# Patient Record
Sex: Male | Born: 1995 | Race: Black or African American | Hispanic: No | Marital: Single | Smoking: Current every day smoker
Health system: Southern US, Community
[De-identification: ages and names within clinical notes are randomized; demographics above are authoritative.]

## PROBLEM LIST (undated history)

## (undated) DIAGNOSIS — F909 Attention-deficit hyperactivity disorder, unspecified type: Secondary | ICD-10-CM

---
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
## 2016-03-14 ENCOUNTER — Encounter: Payer: 59 | Admitting: Licensed Clinical Social Worker

## 2016-03-14 ENCOUNTER — Encounter: Payer: Self-pay | Admitting: Family

## 2016-03-14 ENCOUNTER — Ambulatory Visit (INDEPENDENT_AMBULATORY_CARE_PROVIDER_SITE_OTHER): Payer: 59 | Admitting: Family

## 2016-03-14 VITALS — BP 130/83 | HR 57 | Ht 71.65 in | Wt 204.2 lb

## 2016-03-14 DIAGNOSIS — Z202 Contact with and (suspected) exposure to infections with a predominantly sexual mode of transmission: Secondary | ICD-10-CM | POA: Diagnosis not present

## 2016-03-14 DIAGNOSIS — Z113 Encounter for screening for infections with a predominantly sexual mode of transmission: Secondary | ICD-10-CM | POA: Diagnosis not present

## 2016-03-14 MED ORDER — AZITHROMYCIN 500 MG PO TABS
1000.0000 mg | ORAL_TABLET | Freq: Once | ORAL | Status: AC
  Administered 2016-03-14: 1000 mg via ORAL

## 2016-03-14 MED ORDER — CEFTRIAXONE SODIUM 250 MG IJ SOLR
250.0000 mg | Freq: Once | INTRAMUSCULAR | Status: AC
  Administered 2016-03-14: 250 mg via INTRAMUSCULAR

## 2016-03-14 NOTE — Progress Notes (Signed)
History was provided by the patient.  Daniel [name] is a 20 y.o. male who is here for gonorrhea exposure.   PCP confirmed? No   HPI:   Noticed penile discharge for about a week; discharge has stopped about a week now.  No burning with urination.  Male partners only; 3-4 male partners; 2 only now. One with condom, one without.  Both partners went to dr. Was told she had gonorrhea. Vaginal intercourse only.   Donates plasma - told he was fine. Tried to donate   Review of Systems  Constitutional: Negative for malaise/fatigue.  Eyes: Negative for double vision.  Respiratory: Negative for shortness of breath.   Cardiovascular: Negative for chest pain and palpitations.  Gastrointestinal: Negative for abdominal pain, constipation, diarrhea, nausea and vomiting.  Genitourinary: Negative for dysuria.  Musculoskeletal: Negative for joint pain and myalgias.  Skin: Negative for rash.  Neurological: Negative for dizziness and headaches.  Endo/Heme/Allergies: Does not bruise/bleed easily.    Patient Active Problem List   Diagnosis Date Noted  . ADHD (attention deficit hyperactivity disorder) 08/31/2014  . Gonorrhea 04/14/2014  . Chlamydia 04/14/2014    Current Outpatient Prescriptions on File Prior to Visit  Medication Sig Dispense Refill  . guanFACINE (INTUNIV) 1 MG TB24 Take 1 mg by mouth daily.     No current facility-administered medications on file prior to visit.     No Known Allergies  Physical Exam:    Vitals:   03/14/16 0938  Weight: 204 lb 3.2 oz (92.6 kg)  Height: 5' 11.65" (1.82 m)    No blood pressure reading on file for this encounter. No LMP for male patient.  Physical Exam  Constitutional: He appears well-developed. No distress.  HENT:  Mouth/Throat: Oropharynx is clear and moist.  Cardiovascular: Normal rate and regular rhythm.   No murmur heard. Pulmonary/Chest: Breath sounds normal.  Abdominal: Soft. He exhibits no mass. There is no  tenderness. There is no guarding.  Genitourinary: Penis normal. No penile tenderness.  Genitourinary Comments: No discharge   Musculoskeletal: He exhibits no edema.  Lymphadenopathy:    He has no cervical adenopathy.  Neurological: He is alert.  Skin: Skin is warm. No rash noted.  Psychiatric: He has a normal mood and affect.    Assessment/Plan: 1. Exposure to gonorrhea -will treat due to known exposure.  -advised to contact all partners -abstain x 7 days -condoms given; safe sex practices reviewed.  - cefTRIAXone (ROCEPHIN) injection 250 mg; Inject 250 mg into the muscle once. - azithromycin (ZITHROMAX) tablet 1,000 mg; Take 2 tablets (1,000 mg total) by mouth once.  2. Routine screening for STI (sexually transmitted infection) -per protocol  - GC/Chlamydia Probe Amp - RPR - HIV antibody - GC/CT Probe, Amp (Throat)

## 2016-03-14 NOTE — Patient Instructions (Signed)
We will call you with test results.  If there are other partners you need to notify, you can use  [email or web address] website to let them know.  You put their phone number in the website and they are notified. It is anonymous and not tracked.   Thank you for coming in today! Wear condoms to reduce the risks of future infections!

## 2016-03-15 LAB — GC/CHLAMYDIA PROBE AMP
CT Probe RNA: NOT DETECTED
GC PROBE AMP APTIMA: DETECTED — AB

## 2016-03-15 LAB — HIV ANTIBODY (ROUTINE TESTING W REFLEX): HIV 1&2 Ab, 4th Generation: NONREACTIVE

## 2016-03-15 LAB — RPR

## 2016-03-17 LAB — GC/CHLAMYDIA PROBE, AMP (THROAT)
CHLAMYDIA TRACHOMATIS RNA (THROAT) APTIMA: NOT DETECTED
NEISSERIA GONORRHOEAE RNA (THROAT): NOT DETECTED

## 2017-06-16 ENCOUNTER — Emergency Department (HOSPITAL_COMMUNITY)
Admission: EM | Admit: 2017-06-16 | Discharge: 2017-06-17 | Disposition: A | Discharge status: Home / Self Care | Payer: 59 | Attending: Emergency Medicine | Admitting: Emergency Medicine

## 2017-06-16 ENCOUNTER — Encounter (HOSPITAL_COMMUNITY): Payer: Self-pay

## 2017-06-16 ENCOUNTER — Other Ambulatory Visit: Payer: Self-pay

## 2017-06-16 ENCOUNTER — Emergency Department (HOSPITAL_COMMUNITY): Payer: 59

## 2017-06-16 DIAGNOSIS — J189 Pneumonia, unspecified organism: Secondary | ICD-10-CM | POA: Insufficient documentation

## 2017-06-16 DIAGNOSIS — R05 Cough: Secondary | ICD-10-CM | POA: Diagnosis not present

## 2017-06-16 DIAGNOSIS — J029 Acute pharyngitis, unspecified: Secondary | ICD-10-CM | POA: Diagnosis present

## 2017-06-16 DIAGNOSIS — Z79899 Other long term (current) drug therapy: Secondary | ICD-10-CM | POA: Diagnosis not present

## 2017-06-16 DIAGNOSIS — F909 Attention-deficit hyperactivity disorder, unspecified type: Secondary | ICD-10-CM | POA: Insufficient documentation

## 2017-06-16 DIAGNOSIS — F17200 Nicotine dependence, unspecified, uncomplicated: Secondary | ICD-10-CM | POA: Insufficient documentation

## 2017-06-16 LAB — RAPID STREP SCREEN (MED CTR MEBANE ONLY): Streptococcus, Group A Screen (Direct): NEGATIVE

## 2017-06-16 MED ORDER — ACETAMINOPHEN 325 MG PO TABS
650.0000 mg | ORAL_TABLET | Freq: Once | ORAL | Status: AC | PRN
  Administered 2017-06-16: 650 mg via ORAL
  Filled 2017-06-16: qty 2

## 2017-06-16 MED ORDER — AZITHROMYCIN 250 MG PO TABS: 250.0000 mg | ORAL_TABLET | Freq: Every day | ORAL | 0 refills | Status: AC

## 2017-06-16 MED ORDER — IBUPROFEN 400 MG PO TABS
600.0000 mg | ORAL_TABLET | Freq: Once | ORAL | Status: AC
  Administered 2017-06-16: 23:00:00 600 mg via ORAL
  Filled 2017-06-16: qty 1

## 2017-06-16 MED ORDER — AZITHROMYCIN 250 MG PO TABS
500.0000 mg | ORAL_TABLET | Freq: Once | ORAL | Status: AC
  Administered 2017-06-17: 500 mg via ORAL
  Filled 2017-06-16: qty 2

## 2017-06-16 MED ORDER — ACETAMINOPHEN 325 MG PO TABS: 650.0000 mg | ORAL_TABLET | Freq: Once | ORAL | Status: DC

## 2017-06-16 NOTE — ED Provider Notes (Signed)
[name]   CSN: [id number] Arrival date & time: 06/16/17  2037     History   Chief Complaint Chief Complaint  Patient presents with  . Influenza    HPI Daniel KoyanagiJeremy B [name] is a 22 y.o. male presenting to the ED for acute onset of sore throat, cough, myalgias, fever and headache that began on Sunday.  Patient states his cough was intermittently productive.  Reports associated dysphagia, however no difficulty breathing or swallowing.  Has been treating his symptoms with Tylenol with some relief.  Denies chest pain, shortness of breath, abdominal pain, neck stiffness or pain, rash.  Denies recent illness or antibiotics.  No sick contacts.  Has not had a flu shot this year.  The history is provided by the patient.    Past Medical History:  Diagnosis Date  . ADHD (attention deficit hyperactivity disorder)     Patient Active Problem List   Diagnosis Date Noted  . ADHD (attention deficit hyperactivity disorder) 08/31/2014  . Gonorrhea 04/14/2014  . Chlamydia 04/14/2014    History reviewed. No pertinent surgical history.     Home Medications    Prior to Admission medications   Medication Sig Start Date End Date Taking? Authorizing Provider  azithromycin (ZITHROMAX) 250 MG tablet Take 1 tablet (250 mg total) by mouth daily for 4 days. Starting tomorrow, take 1 tablet every day until finished. 06/16/17 06/20/17  Mohmed Farver, [place] N, PA-C  guanFACINE (INTUNIV) 1 MG TB24 Take 1 mg by mouth daily.    [provider]    Family History No family history on file.  Social History Social History   Tobacco Use  . Smoking status: Current Every Day Smoker  . Smokeless tobacco: Never Used  Substance Use Topics  . Alcohol use: No    Alcohol/week: 0.0 oz  . Drug use: No     Allergies   Patient has no known allergies.   Review of Systems Review of Systems  Constitutional: Positive for fever.  HENT:  Positive for congestion and sore throat. Negative for ear pain, trouble swallowing and voice change.   Eyes: Negative for photophobia and visual disturbance.  Respiratory: Positive for cough. Negative for shortness of breath.   Cardiovascular: Negative for chest pain.  Gastrointestinal: Positive for nausea. Negative for abdominal pain.  Musculoskeletal: Positive for myalgias (generalized).  Neurological: Positive for headaches.  All other systems reviewed and are negative.    Physical Exam Updated Vital Signs BP 129/81 (BP Location: Left Arm)   Pulse 99   Temp (!) 101.3 F (38.5 C) (Oral)   Resp 17   Ht 6\' 2"  (1.88 m)   Wt 109.8 kg (242 lb)   SpO2 99%   BMI 31.07 kg/m   Physical Exam  Constitutional: He appears well-developed and well-nourished. No distress.  Tolerating secretions.  HENT:  Head: Normocephalic and atraumatic.  Right Ear: Tympanic membrane, external ear and ear canal normal.  Left Ear: Tympanic membrane, external ear and ear canal normal.  Nose: Nose normal.  Mouth/Throat: Uvula is midline. No trismus in the jaw. No uvula swelling. No tonsillar exudate.  Pharynx with erythema, no edema or exudates. Tolerating secretions.  Eyes: Conjunctivae are normal.  Neck: Normal range of motion. Neck supple.  Cardiovascular: Normal rate, regular rhythm, normal heart sounds and intact distal pulses. Exam reveals no gallop and no friction rub.  No murmur heard. Pulmonary/Chest: Effort normal. No stridor. No respiratory distress. He has no wheezes. He has no  rales.  Mildly decreased breath sounds left lung.  Abdominal: Soft. Bowel sounds are normal. He exhibits no distension. There is no tenderness.  Lymphadenopathy:    He has no cervical adenopathy.  Neurological: He is alert.  Skin: Skin is warm.  Psychiatric: He has a normal mood and affect. His behavior is normal.  Nursing note and vitals reviewed.    ED Treatments / Results  Labs (all labs ordered are listed,  but only abnormal results are displayed) Labs Reviewed  RAPID STREP SCREEN (NOT AT [name])  CULTURE, GROUP A STREP [facility])    EKG  EKG Interpretation None       Radiology Dg Chest 2 View  Result Date: 06/16/2017 CLINICAL DATA:  22 y/o  M; cough and fever. EXAM: CHEST  2 VIEW COMPARISON:  None. FINDINGS: Normal cardiac silhouette. Patchy left lower lobe and left upper lobe lingula pneumonia. No pleural effusion or pneumothorax. Bones are unremarkable. IMPRESSION: Patchy left lower lobe and left upper lobe lingula pneumonia. Electronically Signed   By: Mitzi Hansen M.D.   On: 06/16/2017 23:43    Procedures Procedures (including critical care time)  Medications Ordered in ED Medications  acetaminophen (TYLENOL) tablet 650 mg (650 mg Oral Given 06/16/17 2050)  ibuprofen (ADVIL,MOTRIN) tablet 600 mg (600 mg Oral Given 06/16/17 2326)  azithromycin (ZITHROMAX) tablet 500 mg (500 mg Oral Given 06/17/17 0003)     Initial Impression / Assessment and Plan / ED Course  I have reviewed the triage vital signs and the nursing notes.  Pertinent labs & imaging results that were available during my care of the patient were reviewed by me and considered in my medical decision making (see chart for details).     Patient has been diagnosed with CAP via chest xray. Pt initially febrile and slightly tachycardic, however vital signs normalized after PO tylenol. Pt is not ill appearing, immunocompromised, and does not have multiple co-morbidities, therefore I feel like the they can be treated as an OP with abx therapy. Tolerating secretions, no inc work of breathing, O2 sat 99% on RA. First dose of Azithromycin given in ED. Will send with Rx for remaining doses. PCP follow up recommended and discussed. Pt has been advised to return to the ED if symptoms worsen or they do not improve. Pt verbalizes understanding and is agreeable with plan.   Discussed results, findings, treatment and follow up.  Patient advised of return precautions. Patient verbalized understanding and agreed with plan.  Final Clinical Impressions(s) / ED Diagnoses   Final diagnoses:  Community acquired pneumonia of left lung, unspecified part of lung    ED Discharge Orders        Ordered    azithromycin (ZITHROMAX) 250 MG tablet  Daily     06/16/17 2359       Demareon Coldwell, [place] N, PA-C 06/17/17 0010    Nira Conn, MD 06/17/17 0022

## 2017-06-16 NOTE — ED Triage Notes (Signed)
Pt states for the past two days, has had headache, nausea, sore throat, cough, malaise, and fevers

## 2017-06-16 NOTE — Discharge Instructions (Signed)
Please read instructions below.  You can take tylenol as needed for sore throat or body aches. Alternate this with advil/ibuprofen.  Drink plenty of water.  Tomorrow, begin taking the antibiotic daily until gone.  Follow up with your primary care provider.  Return to the ER for difficulty swallowing liquids, difficulty breathing, or new or worsening symptoms.

## 2017-06-16 NOTE — ED Notes (Signed)
[facility] PA request a reassessment of vitals before accepting pt on [facility] side.

## 2017-06-16 NOTE — ED Notes (Signed)
Pt to xray

## 2017-06-19 LAB — CULTURE, GROUP A STREP (THRC)

## 2018-08-20 ENCOUNTER — Other Ambulatory Visit: Payer: Self-pay

## 2018-08-20 ENCOUNTER — Encounter (HOSPITAL_COMMUNITY): Payer: Self-pay

## 2018-08-20 ENCOUNTER — Emergency Department (HOSPITAL_COMMUNITY)
Admission: EM | Admit: 2018-08-20 | Discharge: 2018-08-20 | Disposition: A | Discharge status: Home / Self Care | Payer: 59 | Attending: Emergency Medicine | Admitting: Emergency Medicine

## 2018-08-20 DIAGNOSIS — H10213 Acute toxic conjunctivitis, bilateral: Secondary | ICD-10-CM | POA: Diagnosis not present

## 2018-08-20 DIAGNOSIS — F909 Attention-deficit hyperactivity disorder, unspecified type: Secondary | ICD-10-CM | POA: Diagnosis not present

## 2018-08-20 DIAGNOSIS — F17210 Nicotine dependence, cigarettes, uncomplicated: Secondary | ICD-10-CM | POA: Insufficient documentation

## 2018-08-20 DIAGNOSIS — T65893A Toxic effect of other specified substances, assault, initial encounter: Secondary | ICD-10-CM | POA: Diagnosis not present

## 2018-08-20 DIAGNOSIS — H5789 Other specified disorders of eye and adnexa: Secondary | ICD-10-CM | POA: Diagnosis not present

## 2018-08-20 DIAGNOSIS — Z79899 Other long term (current) drug therapy: Secondary | ICD-10-CM | POA: Insufficient documentation

## 2018-08-20 DIAGNOSIS — L253 Unspecified contact dermatitis due to other chemical products: Secondary | ICD-10-CM
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
## 2019-05-12 ENCOUNTER — Other Ambulatory Visit: Payer: Self-pay

## 2019-05-12 ENCOUNTER — Emergency Department (HOSPITAL_COMMUNITY)
Admission: EM | Admit: 2019-05-12 | Discharge: 2019-05-12 | Disposition: A | Discharge status: Home / Self Care | Payer: 59 | Attending: Emergency Medicine | Admitting: Emergency Medicine

## 2019-05-12 ENCOUNTER — Emergency Department (HOSPITAL_COMMUNITY): Payer: 59

## 2019-05-12 DIAGNOSIS — R079 Chest pain, unspecified: Secondary | ICD-10-CM

## 2019-05-12 DIAGNOSIS — R0789 Other chest pain: Secondary | ICD-10-CM | POA: Diagnosis not present

## 2019-05-12 DIAGNOSIS — F17200 Nicotine dependence, unspecified, uncomplicated: Secondary | ICD-10-CM | POA: Diagnosis not present

## 2019-05-12 LAB — BASIC METABOLIC PANEL
Anion gap: 10 (ref 5–15)
BUN: 9 mg/dL (ref 6–20)
CO2: 24 mmol/L (ref 22–32)
Calcium: 9 mg/dL (ref 8.9–10.3)
Chloride: 107 mmol/L (ref 98–111)
Creatinine, Ser: 0.86 mg/dL (ref 0.61–1.24)
GFR calc Af Amer: >60 mL/min (ref >60)
GFR calc non Af Amer: >60 mL/min (ref >60)
Glucose, Bld: 121 mg/dL — ABNORMAL HIGH (ref 70–99)
Potassium: 3.7 mmol/L (ref 3.5–5.1)
Sodium: 141 mmol/L (ref 135–145)

## 2019-05-12 LAB — CBC
HCT: 45.7 % (ref 39.0–52.0)
Hemoglobin: 15.1 g/dL (ref 13.0–17.0)
MCH: 30.8 pg (ref 26.0–34.0)
MCHC: 33 g/dL (ref 30.0–36.0)
MCV: 93.3 fL (ref 80.0–100.0)
Platelets: 274 10*3/uL (ref 150–400)
RBC: 4.9 MIL/uL (ref 4.22–5.81)
RDW: 12.3 % (ref 11.5–15.5)
WBC: 6.7 10*3/uL (ref 4.0–10.5)
nRBC: 0 % (ref 0.0–0.2)

## 2019-05-12 LAB — TROPONIN I (HIGH SENSITIVITY): Troponin I (High Sensitivity): 3 ng/L (ref <18)

## 2019-05-12 MED ORDER — ACETAMINOPHEN 500 MG PO TABS
1000.0000 mg | ORAL_TABLET | Freq: Once | ORAL | Status: AC
  Administered 2019-05-12: 1000 mg via ORAL
  Filled 2019-05-12: qty 2

## 2019-05-12 NOTE — ED Triage Notes (Signed)
Pt endorses mid chest pain and "being hot and cold" x 2 days, worse today. Denies shob, n/v. VSS

## 2019-05-12 NOTE — ED Provider Notes (Signed)
[name] EMERGENCY DEPARTMENT Provider Note   CSN: [id number] Arrival date & time: 05/12/19  1548     History Chief Complaint  Patient presents with  . Chest Pain    Daniel [name] is a 23 y.o. male.  The history is provided by the patient.  Chest Pain Pain location:  Substernal area Pain quality: aching   Pain radiates to:  Does not radiate Pain severity:  Mild Onset quality:  Gradual Duration:  2 days Timing:  Intermittent Progression:  Waxing and waning Chronicity:  New Context: at rest   Relieved by:  Nothing Worsened by:  Nothing Associated symptoms: no abdominal pain, no back pain, no cough, no fever, no palpitations, no shortness of breath and no vomiting   Risk factors: no coronary artery disease, no high cholesterol, no hypertension, no prior DVT/PE and no smoking        Past Medical History:  Diagnosis Date  . ADHD (attention deficit hyperactivity disorder)     Patient Active Problem List   Diagnosis Date Noted  . ADHD (attention deficit hyperactivity disorder) 08/31/2014  . Gonorrhea 04/14/2014  . Chlamydia 04/14/2014    No past surgical history on file.     No family history on file.  Social History   Tobacco Use  . Smoking status: Current Every Day Smoker  . Smokeless tobacco: Never Used  Substance Use Topics  . Alcohol use: No    Alcohol/week: 0.0 standard drinks  . Drug use: No    Home Medications Prior to Admission medications   Medication Sig Start Date End Date Taking? Authorizing Provider  ibuprofen (ADVIL) 200 MG tablet Take 400 mg by mouth every 6 (six) hours as needed for mild pain.   Yes [provider]    Allergies    Patient has no known allergies.  Review of Systems   Review of Systems  Constitutional: Negative for chills and fever.  HENT: Positive for congestion, sinus pressure and sinus pain. Negative for ear pain and sore throat.   Eyes: Negative for pain and visual  disturbance.  Respiratory: Negative for cough and shortness of breath.   Cardiovascular: Positive for chest pain. Negative for palpitations.  Gastrointestinal: Negative for abdominal pain and vomiting.  Genitourinary: Negative for dysuria and hematuria.  Musculoskeletal: Positive for myalgias. Negative for arthralgias and back pain.  Skin: Negative for color change and rash.  Neurological: Negative for seizures and syncope.  All other systems reviewed and are negative.   Physical Exam Updated Vital Signs  ED Triage Vitals [05/12/19 1552]  Enc Vitals Group     BP (!) 131/98     Pulse Rate 93     Resp 16     Temp 98.8 F (37.1 C)     Temp Source Oral     SpO2 100 %     Weight      Height      Head Circumference      Peak Flow      Pain Score      Pain Loc      Pain Edu?      Excl. in [place]?     Physical Exam Vitals and nursing note reviewed.  Constitutional:      Appearance: He is well-developed.  HENT:     Head: Normocephalic and atraumatic.  Eyes:     Extraocular Movements: Extraocular movements intact.     Conjunctiva/sclera: Conjunctivae normal.     Pupils: Pupils are equal, round,  and reactive to light.  Cardiovascular:     Rate and Rhythm: Normal rate and regular rhythm.     Pulses:          Radial pulses are 2+ on the right side and 2+ on the left side.     Heart sounds: Normal heart sounds. No murmur.  Pulmonary:     Effort: Pulmonary effort is normal. No respiratory distress.     Breath sounds: Normal breath sounds. No decreased breath sounds or wheezing.  Abdominal:     Palpations: Abdomen is soft.     Tenderness: There is no abdominal tenderness.  Musculoskeletal:        General: Normal range of motion.     Cervical back: Normal range of motion and neck supple.  Skin:    General: Skin is warm and dry.     Capillary Refill: Capillary refill takes less than 2 seconds.  Neurological:     General: No focal deficit present.     Mental Status: He is  alert.  Psychiatric:        Mood and Affect: Mood normal.     ED Results / Procedures / Treatments   Labs (all labs ordered are listed, but only abnormal results are displayed) Labs Reviewed  BASIC METABOLIC PANEL - Abnormal; Notable for the following components:      Result Value   Glucose, Bld 121 (*)    All other components within normal limits  NOVEL CORONAVIRUS, NAA (HOSP ORDER, SEND-OUT TO REF LAB; TAT 18-24 HRS)  CBC  TROPONIN I (HIGH SENSITIVITY)    EKG EKG Interpretation  Date/Time:  Thursday May 12 2019 15:53:16 EST Ventricular Rate:  92 PR Interval:  146 QRS Duration: 74 QT Interval:  338 QTC Calculation: 417 R Axis:   35 Text Interpretation: Normal sinus rhythm Normal ECG Confirmed by Virgina NorfolkAdam, Crystelle Ferrufino [phone]) on 05/12/2019 5:12:46 PM   Radiology DG Chest 2 View  Result Date: 05/12/2019 CLINICAL DATA:  Chest pain, chills sweats and anxiety for 2 days, history of pneumonia EXAM: CHEST - 2 VIEW COMPARISON:  Radiograph 06/16/2017 FINDINGS: No consolidation, features of edema, pneumothorax, or effusion. Pulmonary vascularity is normally distributed. The cardiomediastinal contours are unremarkable. No acute osseous or soft tissue abnormality. IMPRESSION: No acute cardiopulmonary abnormality. Electronically Signed   By: Kreg ShropshirePrice  DeHay M.D.   On: 05/12/2019 16:30    Procedures Procedures (including critical care time)  Medications Ordered in ED Medications  acetaminophen (TYLENOL) tablet 1,000 mg (has no administration in time range)    ED Course  I have reviewed the triage vital signs and the nursing notes.  Pertinent labs & imaging results that were available during my care of the patient were reviewed by me and considered in my medical decision making (see chart for details).    MDM Rules/Calculators/A&P  Daniel [name] is a 23 year old male who presents to the ED with nasal congestion, chest pain.  Patient with normal vitals.  No fever.   Symptoms over the last 2 days.  Symptoms seem viral in nature.  Likely coronavirus and flu.  Chest x-ray with no infectious findings.  No pneumothorax.  Troponin normal.  EKG shows sinus rhythm.  No ischemic changes.  Patient is PERC negative and doubt PE.  No signs of throat infection on exam.  Overall well-appearing.  Suspect viral process.  Swab for coronavirus.  Given information about self isolation.  Discharged from ED in good condition.  Given return precautions.  This chart was dictated  using voice recognition software.  Despite best efforts to proofread,  errors can occur which can change the documentation meaning.   Daniel [name] was evaluated in Emergency Department on 05/12/2019 for the symptoms described in the history of present illness. He was evaluated in the context of the global COVID-19 pandemic, which necessitated consideration that the patient might be at risk for infection with the SARS-CoV-2 virus that causes COVID-19. Institutional protocols and algorithms that pertain to the evaluation of patients at risk for COVID-19 are in a state of rapid change based on information released by regulatory bodies including the CDC and federal and state organizations. These policies and algorithms were followed during the patient's care in the ED.  Final Clinical Impression(s) / ED Diagnoses Final diagnoses:  Nonspecific chest pain    Rx / DC Orders ED Discharge Orders    None       Virgina Norfolk, DO 05/12/19 1731

## 2019-05-12 NOTE — ED Notes (Signed)
Pt refusing COVID swab. Dr. Ronnald Nian made aware.

## 2019-07-30 ENCOUNTER — Emergency Department (HOSPITAL_COMMUNITY)
Admission: EM | Admit: 2019-07-30 | Discharge: 2019-07-30 | Disposition: A | Discharge status: Home / Self Care | Payer: 59 | Attending: Emergency Medicine | Admitting: Emergency Medicine

## 2019-07-30 DIAGNOSIS — R064 Hyperventilation: Secondary | ICD-10-CM | POA: Diagnosis not present

## 2019-07-30 DIAGNOSIS — T402X1A Poisoning by other opioids, accidental (unintentional), initial encounter: Secondary | ICD-10-CM | POA: Diagnosis not present

## 2019-07-30 DIAGNOSIS — R55 Syncope and collapse: Secondary | ICD-10-CM | POA: Diagnosis present

## 2019-07-30 DIAGNOSIS — T40601A Poisoning by unspecified narcotics, accidental (unintentional), initial encounter: Secondary | ICD-10-CM

## 2019-07-30 DIAGNOSIS — F17200 Nicotine dependence, unspecified, uncomplicated: Secondary | ICD-10-CM | POA: Insufficient documentation

## 2019-07-30 MED ORDER — NALOXONE HCL 4 MG/0.1ML NA LIQD
1.0000 | Freq: Once | NASAL | Status: AC
  Administered 2019-07-30: 20:00:00 1 via NASAL
  Filled 2019-07-30: qty 4

## 2019-07-30 NOTE — ED Triage Notes (Signed)
Pt here for OD on 30 mg percocet. Upon EMS arrival, pt aox4 but became unresponsive, O2 sat dropped to 82% on RA, placed on 3L O2 via [place], O2 sat increased to 99%. VSS, pt alert to self.

## 2019-07-30 NOTE — ED Notes (Signed)
0.4 mg narcan given by this RN at 1735, verbal order from Dr. Fredderick Phenix

## 2019-07-30 NOTE — ED Provider Notes (Signed)
[facility] EMERGENCY DEPARTMENT Provider Note   CSN: [id number] Arrival date & time: 07/30/19  1726     History Chief Complaint  Patient presents with  . Drug Overdose    [name] is a 24 y.o. male.  Patient is a 24 year old male who presents after an overdose.  Per EMS, he took Percocet 30 mg prior to arrival.  He denies taking anything else.  He denies any alcohol use.  He denies any thoughts of wanting to harm himself.  Per EMS, he was alert and oriented on scene and initially refusing transport.  However he then started to become more sleepy and his oxygen level started to drop.  They then were able to convince him with the help of his family member to be transported to the emergency room.          Past Medical History:  Diagnosis Date  . ADHD (attention deficit hyperactivity disorder)     Patient Active Problem List   Diagnosis Date Noted  . ADHD (attention deficit hyperactivity disorder) 08/31/2014  . Gonorrhea 04/14/2014  . Chlamydia 04/14/2014    No past surgical history on file.     No family history on file.  Social History   Tobacco Use  . Smoking status: Current Every Day Smoker  . Smokeless tobacco: Never Used  Substance Use Topics  . Alcohol use: No    Alcohol/week: 0.0 standard drinks  . Drug use: No    Home Medications Prior to Admission medications   Not on File    Allergies    Patient has no known allergies.  Review of Systems   Review of Systems  Constitutional: Negative for chills, diaphoresis, fatigue and fever.  HENT: Negative for congestion, rhinorrhea and sneezing.   Eyes: Negative.   Respiratory: Negative for cough, chest tightness and shortness of breath.   Cardiovascular: Negative for chest pain and leg swelling.  Gastrointestinal: Negative for abdominal pain, diarrhea, nausea and vomiting.  Genitourinary: Negative.   Musculoskeletal: Negative for arthralgias and back pain.  Skin:  Negative for rash.  Neurological: Negative for dizziness, speech difficulty, weakness, numbness and headaches.  Psychiatric/Behavioral: Negative for suicidal ideas.    Physical Exam Updated Vital Signs BP 131/79   Pulse 61   Temp 98.7 F (37.1 C) (Oral)   Resp (!) 24   SpO2 100%   Physical Exam Constitutional:      Appearance: He is well-developed.     Comments: Patient is very sleepy and difficult to arouse.  He is arousable by sternal rub.  HENT:     Head: Normocephalic and atraumatic.  Eyes:     Pupils: Pupils are equal, round, and reactive to light.  Cardiovascular:     Rate and Rhythm: Normal rate and regular rhythm.     Heart sounds: Normal heart sounds.  Pulmonary:     Effort: Pulmonary effort is normal. No respiratory distress.     Breath sounds: Normal breath sounds. No wheezing or rales.  Chest:     Chest wall: No tenderness.  Abdominal:     General: Bowel sounds are normal.     Palpations: Abdomen is soft.     Tenderness: There is no abdominal tenderness. There is no guarding or rebound.  Musculoskeletal:        General: Normal range of motion.     Cervical back: Normal range of motion and neck supple.  Lymphadenopathy:     Cervical: No cervical adenopathy.  Skin:  General: Skin is warm and dry.     Findings: No rash.     ED Results / Procedures / Treatments   Labs (all labs ordered are listed, but only abnormal results are displayed) Labs Reviewed - No data to display  EKG EKG Interpretation  Date/Time:  Saturday July 30 2019 17:33:07 EST Ventricular Rate:  85 PR Interval:    QRS Duration: 74 QT Interval:  352 QTC Calculation: 419 R Axis:   32 Text Interpretation: Sinus rhythm since last tracing no significant change Confirmed by Malvin Johns [phone]) on 07/30/2019 5:59:35 PM   Radiology No results found.  Procedures Procedures (including critical care time)  Medications Ordered in ED Medications  naloxone (NARCAN) nasal spray 4  mg/0.1 mL (has no administration in time range)    ED Course  I have reviewed the triage vital signs and the nursing notes.  Pertinent labs & imaging results that were available during my care of the patient were reviewed by me and considered in my medical decision making (see chart for details).    MDM Rules/Calculators/A&P                      Patient becoming more sleepy during EMS transport.  Initially on arrival he would respond to a sternal rub and answer questions.  He then became less responsive and was hypoventilating.  He was given 0.4 mg of Narcan and responded well.  He was able to wake up and answer questions.  He became very anxious and was wanting to leave.  He is still denying suicidal ideations.  At this point he is agreeable to at least stay for 30 minutes for monitoring.  He was monitored for an extended amount time.  He is fully alert and oriented and ready to go.  Return precautions were given. Final Clinical Impression(s) / ED Diagnoses Final diagnoses:  Opiate overdose, accidental or unintentional, initial encounter [facility])    Rx / DC Orders ED Discharge Orders    None       Malvin Johns, MD 07/30/19 1956

## 2019-07-30 NOTE — ED Notes (Signed)
Pt verbalized understanding of discharge instructions. PT given nasal narcan for home use if needed. Pt had no further questions, ambulated independently to lobby.

## 2019-10-06 ENCOUNTER — Emergency Department (HOSPITAL_COMMUNITY)
Admission: EM | Admit: 2019-10-06 | Discharge: 2019-10-06 | Disposition: A | Discharge status: Home / Self Care | Payer: 59 | Attending: Emergency Medicine | Admitting: Emergency Medicine

## 2019-10-06 ENCOUNTER — Emergency Department (HOSPITAL_COMMUNITY): Payer: 59

## 2019-10-06 ENCOUNTER — Other Ambulatory Visit: Payer: Self-pay

## 2019-10-06 ENCOUNTER — Encounter (HOSPITAL_COMMUNITY): Payer: Self-pay | Admitting: Emergency Medicine

## 2019-10-06 DIAGNOSIS — F17210 Nicotine dependence, cigarettes, uncomplicated: Secondary | ICD-10-CM | POA: Diagnosis not present

## 2019-10-06 DIAGNOSIS — X58XXXA Exposure to other specified factors, initial encounter: Secondary | ICD-10-CM | POA: Diagnosis not present

## 2019-10-06 DIAGNOSIS — F909 Attention-deficit hyperactivity disorder, unspecified type: Secondary | ICD-10-CM | POA: Diagnosis not present

## 2019-10-06 DIAGNOSIS — R4182 Altered mental status, unspecified: Secondary | ICD-10-CM | POA: Diagnosis present

## 2019-10-06 DIAGNOSIS — T50901A Poisoning by unspecified drugs, medicaments and biological substances, accidental (unintentional), initial encounter: Secondary | ICD-10-CM | POA: Insufficient documentation

## 2019-10-06 DIAGNOSIS — Y939 Activity, unspecified: Secondary | ICD-10-CM | POA: Diagnosis not present

## 2019-10-06 DIAGNOSIS — Y999 Unspecified external cause status: Secondary | ICD-10-CM | POA: Insufficient documentation

## 2019-10-06 DIAGNOSIS — Y929 Unspecified place or not applicable: Secondary | ICD-10-CM | POA: Diagnosis not present

## 2019-10-06 DIAGNOSIS — S0081XA Abrasion of other part of head, initial encounter: Secondary | ICD-10-CM | POA: Diagnosis not present

## 2019-10-06 LAB — CBC
HCT: 52.7 % — ABNORMAL HIGH (ref 39.0–52.0)
Hemoglobin: 16.9 g/dL (ref 13.0–17.0)
MCH: 30.1 pg (ref 26.0–34.0)
MCHC: 32.1 g/dL (ref 30.0–36.0)
MCV: 93.8 fL (ref 80.0–100.0)
Platelets: 377 10*3/uL (ref 150–400)
RBC: 5.62 MIL/uL (ref 4.22–5.81)
RDW: 12.2 % (ref 11.5–15.5)
WBC: 17.9 10*3/uL — ABNORMAL HIGH (ref 4.0–10.5)
nRBC: 0 % (ref 0.0–0.2)

## 2019-10-06 LAB — COMPREHENSIVE METABOLIC PANEL
ALT: 18 U/L (ref 0–44)
AST: 23 U/L (ref 15–41)
Albumin: 3.3 g/dL — ABNORMAL LOW (ref 3.5–5.0)
Alkaline Phosphatase: 59 U/L (ref 38–126)
Anion gap: 8 (ref 5–15)
BUN: 7 mg/dL (ref 6–20)
CO2: 26 mmol/L (ref 22–32)
Calcium: 7.8 mg/dL — ABNORMAL LOW (ref 8.9–10.3)
Chloride: 103 mmol/L (ref 98–111)
Creatinine, Ser: 1.04 mg/dL (ref 0.61–1.24)
GFR calc Af Amer: >60 mL/min (ref >60)
GFR calc non Af Amer: >60 mL/min (ref >60)
Glucose, Bld: 232 mg/dL — ABNORMAL HIGH (ref 70–99)
Potassium: 4.2 mmol/L (ref 3.5–5.1)
Sodium: 137 mmol/L (ref 135–145)
Total Bilirubin: 0.6 mg/dL (ref 0.3–1.2)
Total Protein: 6.2 g/dL — ABNORMAL LOW (ref 6.5–8.1)

## 2019-10-06 LAB — RAPID URINE DRUG SCREEN, HOSP PERFORMED
Amphetamines: NOT DETECTED
Barbiturates: NOT DETECTED
Benzodiazepines: NOT DETECTED
Cocaine: NOT DETECTED
Opiates: NOT DETECTED
Tetrahydrocannabinol: NOT DETECTED

## 2019-10-06 LAB — ETHANOL: Alcohol, Ethyl (B): <10 mg/dL (ref <10)

## 2019-10-06 LAB — SALICYLATE LEVEL: Salicylate Lvl: <7 mg/dL — ABNORMAL LOW (ref 7.0–30.0)

## 2019-10-06 LAB — ACETAMINOPHEN LEVEL
Acetaminophen (Tylenol), Serum: <10 ug/mL — ABNORMAL LOW (ref 10–30)
Acetaminophen (Tylenol), Serum: <10 ug/mL — ABNORMAL LOW (ref 10–30)

## 2019-10-06 MED ORDER — NALOXONE HCL 0.4 MG/ML IJ SOLN
0.4000 mg | Freq: Once | INTRAMUSCULAR | Status: AC
  Administered 2019-10-06: 0.4 mg via INTRAVENOUS

## 2019-10-06 MED ORDER — SODIUM CHLORIDE 0.9 % IV BOLUS
1000.0000 mL | Freq: Once | INTRAVENOUS | Status: AC
  Administered 2019-10-06: 1000 mL via INTRAVENOUS

## 2019-10-06 MED ORDER — NALOXONE HCL 0.4 MG/ML IJ SOLN
INTRAMUSCULAR | Status: AC
  Administered 2019-10-06: 0.4 mg via INTRAVENOUS
  Filled 2019-10-06: qty 1

## 2019-10-06 NOTE — ED Provider Notes (Signed)
[facility] EMERGENCY DEPARTMENT Provider Note  CSN: [id number] Arrival date & time: 10/06/19 0009  Chief Complaint(s) Drug Overdose ED Triage Notes Gloster, Lilyan Gilford, RN (Registered Nurse) . [facility] . . 10/06/2019 12:23 AM . . Signed   Per EMS, pt was found unresponsive in a parking lot (3-5 minutes before EMS arrived).  Given 2 doses of Narcan and ventilated.  While in route given . of Narcan when his respirations decreased.  Has an abrasion to Left cheek, swelling to right hand and right knee.    When asked why he overdosed on percocet he stated, "because I have too much going on"      HPI Daniel [name] is a 24 y.o. male who presented for altered mental status likely opiate overdose responded to Narcan.   HPI  Past Medical History Past Medical History:  Diagnosis Date  . ADHD (attention deficit hyperactivity disorder)    Patient Active Problem List   Diagnosis Date Noted  . ADHD (attention deficit hyperactivity disorder) 08/31/2014  . Gonorrhea 04/14/2014  . Chlamydia 04/14/2014   Home Medication(s) Prior to Admission medications   Not on File                                                                                                                                    Past Surgical History History reviewed. No pertinent surgical history. Family History No family history on file.  Social History Social History   Tobacco Use  . Smoking status: Current Every Day Smoker  . Smokeless tobacco: Never Used  Substance Use Topics  . Alcohol use: No    Alcohol/week: 0.0 standard drinks  . Drug use: No   Allergies Patient has no known allergies.  Review of Systems Review of Systems  Unable to perform ROS: Mental status change    Physical Exam Vital Signs  I have reviewed the triage vital signs BP 116/70 (BP Location: Left Arm)   Pulse 96   Temp 98.7 F (37.1 C) (Oral)   Resp 12   Ht 6\' 2"  (1.88 m)   Wt  106.6 kg   SpO2 94%   BMI 30.17 kg/m   Physical Exam Vitals reviewed.  Constitutional:      General: He is not in acute distress.    Appearance: He is well-developed. He is not diaphoretic.  HENT:     Head: Normocephalic and atraumatic.      Nose: Nose normal. No nasal tenderness.  Eyes:     General: No scleral icterus.       Right eye: No discharge.        Left eye: No discharge.     Conjunctiva/sclera: Conjunctivae normal.     Pupils: Pupils are equal, round, and reactive to light.  Cardiovascular:     Rate and Rhythm: Normal rate and regular rhythm.     Heart sounds: No murmur. No friction  rub. No gallop.   Pulmonary:     Effort: Pulmonary effort is normal. No respiratory distress.     Breath sounds: Normal breath sounds. No stridor. No rales.  Abdominal:     General: There is no distension.     Palpations: Abdomen is soft.     Tenderness: There is no abdominal tenderness.  Musculoskeletal:        General: No tenderness.     Cervical back: Normal range of motion and neck supple.  Skin:    General: Skin is warm and dry.     Findings: No erythema or rash.  Neurological:     Mental Status: He is oriented to person, place, and time. He is lethargic.     ED Results and Treatments Labs (all labs ordered are listed, but only abnormal results are displayed) Labs Reviewed  COMPREHENSIVE METABOLIC PANEL - Abnormal; Notable for the following components:      Result Value   Glucose, Bld 232 (*)    Calcium 7.8 (*)    Total Protein 6.2 (*)    Albumin 3.3 (*)    All other components within normal limits  SALICYLATE LEVEL - Abnormal; Notable for the following components:   Salicylate Lvl <7.0 (*)    All other components within normal limits  ACETAMINOPHEN LEVEL - Abnormal; Notable for the following components:   Acetaminophen (Tylenol), Serum <10 (*)    All other components within normal limits  CBC - Abnormal; Notable for the following components:   WBC 17.9 (*)    HCT  52.7 (*)    All other components within normal limits  ACETAMINOPHEN LEVEL - Abnormal; Notable for the following components:   Acetaminophen (Tylenol), Serum <10 (*)    All other components within normal limits  ETHANOL  RAPID URINE DRUG SCREEN, HOSP PERFORMED                                                                                                                         EKG  EKG Interpretation  Date/Time:    Ventricular Rate:    PR Interval:    QRS Duration:   QT Interval:    QTC Calculation:   R Axis:     Text Interpretation:        Radiology CT Head Wo Contrast  Result Date: 10/06/2019 CLINICAL DATA:  Reported fall with left cheek abrasion EXAM: CT HEAD WITHOUT CONTRAST TECHNIQUE: Contiguous axial images were obtained from the base of the skull through the vertex without intravenous contrast. COMPARISON:  None. FINDINGS: Brain: No evidence of acute infarction, hemorrhage, hydrocephalus, extra-axial collection or mass lesion/mass effect. Vascular: Diffuse hyperattenuation of the dural venous sinuses and intracranial vasculature, most often seen with elevated hematocrit/hemoconcentration. No focal hyperdense vessel to suggest acute occlusion. Skull: Right parietal and left supraorbital scalp thickening. Mild contusive changes in the right malar soft tissues and swelling across the nasal bridge. No visible calvarial fracture. Deformities of the nasal bones. No other visible  facial bone fracture. Sinuses/Orbits: Minimal thickening in the paranasal sinuses. No air-fluid levels. Mastoid air cells are predominantly clear. Dysconjugate gaze. Orbits are otherwise unremarkable. Other: None IMPRESSION: 1. No acute intracranial abnormality. 2. Diffuse hyperattenuation of the dural venous sinuses and intracranial vasculature, can be seen with elevated hematocrit/hemoconcentration. Correlate with CBC when resulted. 3. Right parietal and left supraorbital scalp thickening. No calvarial fracture 4.  Deformities of the nasal bones and swelling of the nasal bridge, correlate for point tenderness. 5. Contusive change of the right malar soft tissues. No other mid face fracture. 6. Dysconjugate gaze. Electronically Signed   By: Kreg Shropshire M.D.   On: 10/06/2019 01:32    Pertinent labs & imaging results that were available during my care of the patient were reviewed by me and considered in my medical decision making (see chart for details).  Medications Ordered in ED Medications  sodium chloride 0.9 % bolus 1,000 mL (0 mLs Intravenous Stopped 10/06/19 0222)  naloxone [facility]) injection 0.4 mg (0.4 mg Intravenous Given 10/06/19 0116)                                                                                                                                    Procedures Procedures  (including critical care time)  Medical Decision Making / ED Course I have reviewed the nursing notes for this encounter and the patient's prior records (if available in EHR or on provided paperwork).   Daniel [name] was evaluated in Emergency Department on 10/06/2019 for the symptoms described in the history of present illness. He was evaluated in the context of the global COVID-19 pandemic, which necessitated consideration that the patient might be at risk for infection with the SARS-CoV-2 virus that causes COVID-19. Institutional protocols and algorithms that pertain to the evaluation of patients at risk for COVID-19 are in a state of rapid change based on information released by regulatory bodies including the CDC and federal and state organizations. These policies and algorithms were followed during the patient's care in the ED.    Clinical Course as of Oct 05 801  Thu Oct 06, 2019  0110 Patient presents for likely opiate overdose.  He endorses "Perc" use.  Cannot quantify the amount of Percocet use.  Initially said 1 then later said maybe 3 or 5.  He did respond to Narcan by EMS making opiate  overdose likely.  Patient has evidence of facial trauma and will require CT head.  He required additional Narcan dose here in the emergency department.  Given the reported Percocet use, will also obtain acetaminophen levels in addition to coingestion labs.  We will continue to monitor patient and reassess.   [PC]    Clinical Course User Index [PC] Delio Slates, Amadeo Garnet, MD   Patient was allowed to metabolize to freedom.  Labs grossly reassuring.  Acetaminophen levels negative x2.  CT head without ICH.  Mental status is improving.  He  will require additional metabolizing.  Patient looks adamantly denies suicidal ideations or attempt.   Patient care turned over to Dr Langston Masker. Patient case and results discussed in detail; please see their note for further ED managment.      Final Clinical Impression(s) / ED Diagnoses Final diagnoses:  Accidental drug overdose, initial encounter      This chart was dictated using voice recognition software.  Despite best efforts to proofread,  errors can occur which can change the documentation meaning.   Fatima Blank, MD 10/06/19 [phone]

## 2019-10-06 NOTE — ED Provider Notes (Signed)
Clinical Course as of Oct 06 1019  Thu Oct 06, 2019  0110 Patient presents for likely opiate overdose.  He endorses "Perc" use.  Cannot quantify the amount of Percocet use.  Initially said 1 then later said maybe 3 or 5.  He did respond to Narcan by EMS making opiate overdose likely.  Patient has evidence of facial trauma and will require CT head.  He required additional Narcan dose here in the emergency department.  Given the reported Percocet use, will also obtain acetaminophen levels in addition to coingestion labs.  We will continue to monitor patient and reassess.   [PC]    Clinical Course User Index [PC] Cardama, Amadeo Garnet, MD   Patient awake, ambulating steadily now, stable for discharge He denies SI   Renaye Rakers Kermit Balo, MD 10/06/19 1021

## 2019-10-06 NOTE — ED Triage Notes (Signed)
Per EMS, pt was found unresponsive in a parking lot (3-5 minutes before EMS arrived).  Given 2 doses of Narcan and ventilated.  While in route given . of Narcan when his respirations decreased.  Has an abrasion to Left cheek, swelling to right hand and right knee.    When asked why he overdosed on percocet he stated, "because I have too much going on"  116/76 95% RA  16RR CBG "Normal"

## 2019-10-06 NOTE — ED Notes (Signed)
Patient verbalizes understanding of discharge instructions. Opportunity for questioning and answers were provided. Armband removed by staff, pt discharged from ED.  

## 2019-10-25 ENCOUNTER — Other Ambulatory Visit: Payer: Self-pay

## 2019-10-25 ENCOUNTER — Emergency Department (HOSPITAL_COMMUNITY)
Admission: EM | Admit: 2019-10-25 | Discharge: 2019-10-25 | Disposition: A | Discharge status: Home / Self Care | Payer: 59 | Attending: Emergency Medicine | Admitting: Emergency Medicine

## 2019-10-25 ENCOUNTER — Encounter (HOSPITAL_COMMUNITY): Payer: Self-pay

## 2019-10-25 DIAGNOSIS — R55 Syncope and collapse: Secondary | ICD-10-CM | POA: Diagnosis present

## 2019-10-25 DIAGNOSIS — F17200 Nicotine dependence, unspecified, uncomplicated: Secondary | ICD-10-CM | POA: Diagnosis not present

## 2019-10-25 DIAGNOSIS — T40601A Poisoning by unspecified narcotics, accidental (unintentional), initial encounter: Secondary | ICD-10-CM | POA: Diagnosis not present

## 2019-10-25 MED ORDER — NALOXONE HCL 0.4 MG/ML IJ SOLN
0.4000 mg | Freq: Once | INTRAMUSCULAR | Status: AC
  Administered 2019-10-25: 0.4 mg via INTRAVENOUS
  Filled 2019-10-25: qty 1

## 2019-10-25 NOTE — ED Provider Notes (Addendum)
Patient signed to me by Dr. Devoria Albe to be monitor after patient had a unintentional overdose of opiates.  He is now awake and alert x4.  Stable for discharge   Lorre Nick, MD 10/25/19 1022    Lorre Nick, MD 10/25/19 1022

## 2019-10-25 NOTE — ED Provider Notes (Signed)
[facility]-EMERGENCY DEPT Provider Note   CSN: [id number] Arrival date & time: 10/25/19  1245   Time seen 5:35 AM  History Chief Complaint  Patient presents with  . Drug Overdose    Daniel [name] is a 24 y.o. male.  HPI   Patient states he has been using Percocets for about 2 years.  He was just seen in the ED on May 6 with an overdose.  He states he got off work this morning and about 430 he states he took 2 Percocet, he is not sure if he had 20 mg or 30 mg tablets.  He was found in the yard of his friend unresponsive.  EMS was called and gave him a total of 1.5 mg intranasal Narcan, and Zofran after he had some vomiting.  Patient states he takes the pills "to feel good".  He denies any suicidal intent.  He states he knows he needs to get in some type of program like Suboxone.  PCP Department, [facility]   Past Medical History:  Diagnosis Date  . ADHD (attention deficit hyperactivity disorder)     Patient Active Problem List   Diagnosis Date Noted  . ADHD (attention deficit hyperactivity disorder) 08/31/2014  . Gonorrhea 04/14/2014  . Chlamydia 04/14/2014    History reviewed. No pertinent surgical history.     No family history on file.  Social History   Tobacco Use  . Smoking status: Current Every Day Smoker  . Smokeless tobacco: Never Used  Substance Use Topics  . Alcohol use: No    Alcohol/week: 0.0 standard drinks  . Drug use: Yes    Comment: Percocet  employed  Home Medications Prior to Admission medications   Not on File    Allergies    Patient has no known allergies.  Review of Systems   Review of Systems  All other systems reviewed and are negative.   Physical Exam Updated Vital Signs BP 106/66   Pulse 81   Temp 98 F (36.7 C)   Resp 11   SpO2 96%   Physical Exam Vitals and nursing note reviewed.  Constitutional:      Appearance: Normal appearance. He is obese.  HENT:     Head:  Normocephalic and atraumatic.     Right Ear: External ear normal.     Left Ear: External ear normal.  Eyes:     Extraocular Movements: Extraocular movements intact.     Conjunctiva/sclera: Conjunctivae normal.     Pupils: Pupils are equal, round, and reactive to light.     Comments: Midsize pupils  Cardiovascular:     Rate and Rhythm: Normal rate and regular rhythm.  Pulmonary:     Effort: Pulmonary effort is normal. No respiratory distress.  Musculoskeletal:        General: Normal range of motion.     Cervical back: Normal range of motion.  Neurological:     General: No focal deficit present.     Mental Status: He is alert and oriented to person, place, and time.     Cranial Nerves: No cranial nerve deficit.  Psychiatric:        Mood and Affect: Mood normal.        Behavior: Behavior normal.        Thought Content: Thought content normal.     ED Results / Procedures / Treatments   Labs (all labs ordered are listed, but only abnormal results are displayed) Labs Reviewed - No data  to display  EKG None  Radiology No results found.  Procedures Procedures (including critical care time)  Medications Ordered in ED Medications  naloxone (NARCAN) injection 0.4 mg (has no administration in time range)    ED Course  I have reviewed the triage vital signs and the nursing notes.  Pertinent labs & imaging results that were available during my care of the patient were reviewed by me and considered in my medical decision making (see chart for details).    MDM Rules/Calculators/A&P                      Patient is currently awake and interactive and acts normally.  There is no suicidal ideation expressed.  Patient seems to realize he has a problem and seems interested in getting some type of outpatient treatment.  Recheck at 6:30 AM patient's respiratory rate was 5-9 and his pulse ox was 88%.  He was given Narcan 0.4 mg IV.  Pt turned over to Dr Zenia Resides at change of shift, can  be discharged if remains awake for at least 2 hours.  Patient was given resource guide to get outpatient therapy.  Final Clinical Impression(s) / ED Diagnoses Final diagnoses:  Narcotic overdose, accidental or unintentional, initial encounter [facility])    Rx / DC Orders ED Discharge Orders    None      Plan discharge  Rolland Porter, MD, Barbette Or, MD 10/25/19 [phone]

## 2019-10-25 NOTE — Discharge Instructions (Signed)
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  You required getting Narcan several times to keep your breathing normally from what you took tonight.  Please look at the resource guide or you can google Suboxone clinics if that is what you are interested in to help with your addiction.

## 2019-10-25 NOTE — ED Triage Notes (Signed)
Per EMS, Pt, from friend's house, presents after unintentional percocet overdose.  Pt denies SI.  Reports he does it to feel good.  Reports taking 1-2 pills around 0420.  Pt given 1.5mg  Narcan intranasal and 4mg  Zofran given en route.  Pt reports wanting to stop and asking about Suboxone clinics.

## 2019-12-22 ENCOUNTER — Emergency Department (HOSPITAL_COMMUNITY)
Admission: EM | Admit: 2019-12-22 | Discharge: 2019-12-22 | Disposition: A | Discharge status: Home / Self Care | Payer: 59 | Attending: Emergency Medicine | Admitting: Emergency Medicine

## 2019-12-22 ENCOUNTER — Other Ambulatory Visit: Payer: Self-pay

## 2019-12-22 ENCOUNTER — Encounter (HOSPITAL_COMMUNITY): Payer: Self-pay | Admitting: Emergency Medicine

## 2019-12-22 DIAGNOSIS — T402X1A Poisoning by other opioids, accidental (unintentional), initial encounter: Secondary | ICD-10-CM | POA: Diagnosis not present

## 2019-12-22 DIAGNOSIS — T40601A Poisoning by unspecified narcotics, accidental (unintentional), initial encounter: Secondary | ICD-10-CM

## 2019-12-22 DIAGNOSIS — F1110 Opioid abuse, uncomplicated: Secondary | ICD-10-CM | POA: Insufficient documentation

## 2019-12-22 DIAGNOSIS — F1190 Opioid use, unspecified, uncomplicated: Secondary | ICD-10-CM

## 2019-12-22 DIAGNOSIS — F17210 Nicotine dependence, cigarettes, uncomplicated: Secondary | ICD-10-CM | POA: Diagnosis not present

## 2019-12-22 MED ORDER — NALOXONE HCL 4 MG/0.1ML NA LIQD
1.0000 | Freq: Once | NASAL | 0 refills | Status: AC
Start: 2019-12-22 — End: 2019-12-22

## 2019-12-22 MED ORDER — NALOXONE HCL 0.4 MG/ML IJ SOLN
0.4000 mg | Freq: Once | INTRAMUSCULAR | Status: AC
  Administered 2019-12-22: 0.4 mg via INTRAVENOUS
  Filled 2019-12-22: qty 1

## 2019-12-22 NOTE — ED Triage Notes (Signed)
Patient took 120mg  of percocet at 3 am. Patient denies suicidal/homicidal thoughts. Patient found unresponsive on the ground near friends house. Patient was administer 2 mg of Narcan by [employer] intranasally. Fire rescue breathing with bvm for three minutes. PIV # 20 Left AC. Patient was vomiting when he woke up. Patient has been A&O x 4 since then.

## 2019-12-22 NOTE — Discharge Instructions (Addendum)
You should not take pills that are not prescribed to you.  I have attached resources to help you with substance use.  Please follow-up with them.  I have provided you with a Narcan prescription.  Return to the emergency department if you develop respiratory distress, if you become unresponsive, or if you have other new, concerning symptoms.

## 2019-12-22 NOTE — ED Provider Notes (Signed)
[facility]-EMERGENCY DEPT Provider Note   CSN: [id number] Arrival date & time: 12/22/19  0411     History Chief Complaint  Patient presents with  . Drug Overdose    Daniel [name] is a 24 y.o. male with a history of 2 mg of Narcan was administered by [employer] intranasally.  Fire rescue breathing with bag valve mask for 3 minutes.  Patient was vomiting when he woke up.  EMS reported that the patient took 120 mg of Percocet at 3 AM.  He was found laying in a friend's yard.  In the ER, the patient is drowsy, but arouses to loud voice.  States that he took 3 tablets of Percocet at 3 AM.  He is unsure if there are 20 or 30 mg tablets.  States that he took the tablets orally.  He does not have a prescription for the medication.  He denies any other illicit or recreational substance use or alcohol use.  He denies IV drug use. He denies SI or HI.   The history is provided by the patient. No language interpreter was used.       Past Medical History:  Diagnosis Date  . ADHD (attention deficit hyperactivity disorder)     Patient Active Problem List   Diagnosis Date Noted  . ADHD (attention deficit hyperactivity disorder) 08/31/2014  . Gonorrhea 04/14/2014  . Chlamydia 04/14/2014    History reviewed. No pertinent surgical history.     History reviewed. No pertinent family history.  Social History   Tobacco Use  . Smoking status: Current Every Day Smoker  . Smokeless tobacco: Never Used  Substance Use Topics  . Alcohol use: No    Alcohol/week: 0.0 standard drinks  . Drug use: Yes    Comment: Percocet    Home Medications Prior to Admission medications   Medication Sig Start Date End Date Taking? Authorizing Provider  naloxone [facility]) nasal spray 4 mg/0.1 mL Place 1 spray into the nose once for 1 dose. 12/22/19 12/22/19  Athalene Kolle A, PA-C    Allergies    Patient has no known allergies.  Review of Systems   Review of Systems    Constitutional: Negative for appetite change and fever.  HENT: Negative for congestion.   Respiratory: Negative for shortness of breath.   Cardiovascular: Negative for chest pain.  Gastrointestinal: Negative for abdominal pain, diarrhea, nausea and vomiting.  Genitourinary: Negative for dysuria.  Musculoskeletal: Negative for back pain, myalgias and neck stiffness.  Skin: Negative for rash and wound.  Allergic/Immunologic: Negative for immunocompromised state.  Neurological: Negative for syncope, weakness and headaches.  Psychiatric/Behavioral: Negative for confusion.    Physical Exam Updated Vital Signs BP 123/76 (BP Location: Right Arm)   Pulse 64   Temp 98 F (36.7 C) (Oral)   Resp 18   SpO2 98%   Physical Exam Vitals and nursing note reviewed.  Constitutional:      General: He is not in acute distress.    Appearance: He is well-developed. He is not ill-appearing, toxic-appearing or diaphoretic.  HENT:     Head: Normocephalic and atraumatic.     Mouth/Throat:     Mouth: Mucous membranes are moist.  Eyes:     Conjunctiva/sclera: Conjunctivae normal.     Comments: Pupils are small, but equal, round, and reactive.   Cardiovascular:     Rate and Rhythm: Normal rate and regular rhythm.     Pulses: Normal pulses.     Heart sounds: Normal heart  sounds. No murmur heard.  No friction rub. No gallop.   Pulmonary:     Effort: Pulmonary effort is normal. No respiratory distress.     Breath sounds: No stridor. No wheezing, rhonchi or rales.  Chest:     Chest wall: No tenderness.  Abdominal:     General: There is no distension.     Palpations: Abdomen is soft. There is no mass.     Tenderness: There is no abdominal tenderness. There is no right CVA tenderness, left CVA tenderness, guarding or rebound.     Hernia: No hernia is present.  Musculoskeletal:        General: No tenderness.     Cervical back: Neck supple.     Right lower leg: No edema.     Left lower leg: No  edema.  Skin:    General: Skin is warm and dry.     Capillary Refill: Capillary refill takes less than 2 seconds.  Neurological:     Mental Status: He is alert.     Comments: Drowsy, but arouses to loud voice.  He will then fall asleep while he is completing a sentence.  Psychiatric:        Behavior: Behavior normal.     ED Results / Procedures / Treatments   Labs (all labs ordered are listed, but only abnormal results are displayed) Labs Reviewed - No data to display  EKG None  Radiology No results found.  Procedures Procedures (including critical care time)  Medications Ordered in ED Medications  naloxone (NARCAN) injection 0.4 mg (0.4 mg Intravenous Given 12/22/19 0502)    ED Course  I have reviewed the triage vital signs and the nursing notes.  Pertinent labs & imaging results that were available during my care of the patient were reviewed by me and considered in my medical decision making (see chart for details).    MDM Rules/Calculators/A&P                          24 year old male with a history of opioid use disorder presenting by EMS after an unintentional opioid overdose with oral Percocet.  He was 2 mg of Narcan intranasally in route by [employer] and required BVM for 3 minutes.  On arrival to the ER, patient was very drowsy, but arouses to loud voice.  However, he would fall asleep in the middle of a sentence.  He was given 0.4 mg of Narcan with significant improvement in mentation.  Patient was observed for more than 2 hours in the emergency department, and more than an hour and a half after he was administered Narcan.  At discharge, he was awake, playing on his phone, drinking orange juice, and in no acute distress.  GCS 15.  He adamantly denies SI and HI.  Will order Narcan kit for home use and please peer support consult.  All questions answered.  He is hemodynamically stable and in no acute distress.  ER return precautions given.  Safe for discharge to home.  He will  be provided outpatient resources.  Final Clinical Impression(s) / ED Diagnoses Final diagnoses:  Opiate overdose, accidental or unintentional, initial encounter ([place])  Opioid use disorder ([place])    Rx / DC Orders ED Discharge Orders         Ordered    naloxone (NARCAN) nasal spray 4 mg/0.1 mL   Once     Discontinue  Reprint     12/22/19 1829  Joanne Gavel, PA-C 12/22/19 [place], April, MD 12/22/19 [phone]

## 2020-01-02 ENCOUNTER — Emergency Department (HOSPITAL_COMMUNITY)
Admission: EM | Admit: 2020-01-02 | Discharge: 2020-01-02 | Disposition: A | Discharge status: Home / Self Care | Payer: 59 | Attending: Emergency Medicine | Admitting: Emergency Medicine

## 2020-01-02 ENCOUNTER — Encounter (HOSPITAL_COMMUNITY): Payer: Self-pay

## 2020-01-02 ENCOUNTER — Other Ambulatory Visit: Payer: Self-pay

## 2020-01-02 DIAGNOSIS — T402X1A Poisoning by other opioids, accidental (unintentional), initial encounter: Secondary | ICD-10-CM | POA: Diagnosis present

## 2020-01-02 DIAGNOSIS — F17200 Nicotine dependence, unspecified, uncomplicated: Secondary | ICD-10-CM | POA: Insufficient documentation

## 2020-01-02 DIAGNOSIS — T40601A Poisoning by unspecified narcotics, accidental (unintentional), initial encounter: Secondary | ICD-10-CM

## 2020-01-02 MED ORDER — NALOXONE HCL 4 MG/0.1ML NA LIQD
1.0000 | Freq: Once | NASAL | Status: AC
  Administered 2020-01-02: 1 via NASAL
  Filled 2020-01-02: qty 4

## 2020-01-02 MED ORDER — NALOXONE HCL 0.4 MG/ML IJ SOLN: 0.4000 mg | Freq: Once | INTRAMUSCULAR | Status: AC

## 2020-01-02 MED ORDER — NALOXONE HCL 0.4 MG/ML IJ SOLN
INTRAMUSCULAR | Status: AC
  Administered 2020-01-02: 0.4 mg via INTRAVENOUS
  Filled 2020-01-02: qty 1

## 2020-01-02 NOTE — ED Provider Notes (Signed)
[facility]-EMERGENCY DEPT Provider Note   CSN: [id number] Arrival date & time: 01/02/20  1856     History Chief Complaint  Patient presents with  . Drug Overdose    Daniel [name] is a 24 y.o. male with history of several opioid overdoses brought to ER by EMS after found [place] massage chair at mall unresponsive.  Level 5 caveat due to acuity of condition. Patient hypoxic by EMS 60% on RA, CBG 98.  EDP [place] at bedside. Narcan 0.4 mg given.  Patient with almost immediate improvement in mentation.  Able to tell me his name. [place] [employer] [place]. He took 2-3 percocet at 4 pm.  States he takes them sometimes "to feel good".  Denies intent to harm or kill himself. Denies any other drugs or alcohol. States he went to a clinic to get suboxone but never actually got it.  Denies any pain.   HPI     Past Medical History:  Diagnosis Date  . ADHD (attention deficit hyperactivity disorder)     Patient Active Problem List   Diagnosis Date Noted  . ADHD (attention deficit hyperactivity disorder) 08/31/2014  . Gonorrhea 04/14/2014  . Chlamydia 04/14/2014    History reviewed. No pertinent surgical history.     History reviewed. No pertinent family history.  Social History   Tobacco Use  . Smoking status: Current Every Day Smoker  . Smokeless tobacco: Never Used  Substance Use Topics  . Alcohol use: No    Alcohol/week: 0.0 standard drinks  . Drug use: Yes    Comment: Percocet    Home Medications Prior to Admission medications   Not on File    Allergies    Patient has no known allergies.  Review of Systems   Review of Systems  Unable to perform ROS: Acuity of condition  All other systems reviewed and are negative.   Physical Exam Updated Vital Signs BP (!) 121/92   Pulse (!) 53   Temp 99.1 F (37.3 C) (Axillary)   Resp (!) 7   SpO2 100%   Physical Exam Vitals and nursing note reviewed.  Constitutional:        General: He is not in acute distress.    Appearance: He is well-developed.     Comments: NAD.  HENT:     Head: Normocephalic and atraumatic.     Right Ear: External ear normal.     Left Ear: External ear normal.     Nose: Nose normal.     Comments: Nasal airway in place  Eyes:     General: No scleral icterus.    Conjunctiva/sclera: Conjunctivae normal.     Comments: Pinpoint pupils, symmetric bilaterally, reactive to light   Cardiovascular:     Rate and Rhythm: Normal rate and regular rhythm.     Heart sounds: Normal heart sounds. No murmur heard.   Pulmonary:     Breath sounds: No wheezing.     Comments: Nasal airway in place.  On NRB.  Spontaneous but shallow respirations.  Diminished air sounds throughout, no wheezing or crack.es Musculoskeletal:        General: No deformity. Normal range of motion.     Cervical back: Normal range of motion and neck supple.  Skin:    General: Skin is warm and dry.     Capillary Refill: Capillary refill takes less than 2 seconds.  Neurological:     Mental Status: He is alert and oriented to  person, place, and time.     Comments: Patient initially responsive to painful stimulus, inconsistent. Pinpoint pupils.  Spontaneous but slow respirations.  Narcan given and patient with almost immediate response, awake, fully oriented to place, year, full name and events.  Strength and sensation intact bilaterally extremities.  Speech slightly slurred.  Psychiatric:        Behavior: Behavior normal.        Thought Content: Thought content normal.        Judgment: Judgment normal.     ED Results / Procedures / Treatments   Labs (all labs ordered are listed, but only abnormal results are displayed) Labs Reviewed - No data to display  EKG EKG Interpretation  Date/Time:  Monday January 02 2020 19:01:00 EDT Ventricular Rate:  65 PR Interval:    QRS Duration: 71 QT Interval:  380 QTC Calculation: 396 R Axis:   25 Text Interpretation: Sinus  rhythm Confirmed by Virgina Norfolk [phone]) on 01/02/2020 9:25:30 PM   Radiology No results found.  Procedures .Critical Care Performed by: Liberty Handy, PA-C Authorized by: Liberty Handy, PA-C   Critical care provider statement:    Critical care time (minutes):  45   Critical care was necessary to treat or prevent imminent or life-threatening deterioration of the following conditions: opioid overdose.   Critical care was time spent personally by me on the following activities:  Discussions with consultants, evaluation of patient's response to treatment, examination of patient, ordering and performing treatments and interventions, ordering and review of laboratory studies, ordering and review of radiographic studies, pulse oximetry, re-evaluation of patient's condition, obtaining history from patient or surrogate and review of old charts   I assumed direction of critical care for this patient from another provider in my specialty: no     (including critical care time)  Medications Ordered in ED Medications  naloxone (NARCAN) nasal spray 4 mg/0.1 mL (has no administration in time range)  naloxone [facility]) injection 0.4 mg (0.4 mg Intravenous Given 01/02/20 1906)    ED Course  I have reviewed the triage vital signs and the nursing notes.  Pertinent labs & imaging results that were available during my care of the patient were reviewed by me and considered in my medical decision making (see chart for details).    MDM Rules/Calculators/A&P                          24 yo M with previous opioid overdose requiring narcan x 4 this year brought to ER for unresponsiveness.    Initial exam with EDP at bedside. Narcan 0.4 mg given with almost immediate improvement in mentation.  Able to provide history. Admits to taking 2-3 percocets unknown dose.  Denies attempt to self harm or commit suicide.    Continuous cardiac monitoring, end tidal, serial exams. EKG ordered.   Will plan to  monitor for a total of 4 hours after narcan approximately 2230. Anticipate discharge if patient metabolized, ambulatory, tolerating PO.  Repeat narcan as needed. Serial exams. Shared with EDP.   Patient ambulated. Tolerating PO.  VS now stable.  Patient given narcan to go from ED. States he has a doctor at [facility] who was going to prescribe him suboxone but hasn't made an appointment to get it.  Stressed importance of follow up and cessation of percocet.  Final Clinical Impression(s) / ED Diagnoses Final diagnoses:  Opiate overdose, accidental or unintentional, initial encounter (HCC)  Rx / DC Orders ED Discharge Orders    None       Jerrell Mylar 01/02/20 2307    Virgina Norfolk, DO 01/03/20 1735

## 2020-01-02 NOTE — ED Notes (Signed)
Pt ambulatory to the bathroom 

## 2020-01-02 NOTE — ED Notes (Signed)
Pt awake and alert. Pt informed that we need to observe him for another half hour. Pt given phone to call his sister to update her.

## 2020-01-02 NOTE — ED Triage Notes (Signed)
Pt arrived via EMS, [place] mall, unresponsive. Hx of percocet overdose. Pupils pinpoint and non reactive   No meds given.   60% on RA 143/92 RR 8 CBG 98 HR 60  18G L AC

## 2020-01-02 NOTE — ED Notes (Addendum)
Pt speaking in full sentences after narcan, states he took some percocet today. Denies any SI/HI.

## 2020-01-02 NOTE — ED Notes (Signed)
Sister Genesis Goodwine requests to be called when pt is up for discharge

## 2020-01-02 NOTE — ED Provider Notes (Signed)
Medical screening examination/treatment/procedure(s) were conducted as a shared visit with non-physician practitioner(s) and myself.  I personally evaluated the patient during the encounter. Briefly, the patient is a 24 y.o. male with history of ADHD and opioid abuse who presents to the ED after being [place] the mall in a chair.  History of Percocet overdose in the past.  Multiple ED visits for the same.  Patient was 60% on room air with EMS with pinpoint pupils.  Narcan was not given as patient improved on oxygen and was breathing on his own.  Patient given 0.4 mg of Narcan upon arrival here and wakes up and admits to opioid use.  Vital signs are normal.  Will allow for metabolization.  No signs of trauma.  This chart was dictated using voice recognition software.  Despite best efforts to proofread,  errors can occur which can change the documentation meaning.     EKG Interpretation None           Virgina Norfolk, DO 01/02/20 1907

## 2020-01-02 NOTE — Discharge Instructions (Addendum)
Stop using percocets  Go to primary care doctor to obtain referral/prescriptions to help you stop using percocets

## 2020-01-02 NOTE — ED Notes (Signed)
End tidal CO2 is 34

## 2020-12-05 ENCOUNTER — Emergency Department (HOSPITAL_COMMUNITY): Admission: EM | Admit: 2020-12-05 | Discharge: 2020-12-06 | Discharge status: Against Medical Advice | Payer: 59

## 2020-12-05 ENCOUNTER — Other Ambulatory Visit: Payer: Self-pay

## 2022-04-24 ENCOUNTER — Inpatient Hospital Stay (HOSPITAL_COMMUNITY)
Admission: EM | Admit: 2022-04-24 | Discharge: 2022-04-28 | DRG: 871 | Disposition: A | Discharge status: Home / Self Care | Payer: 59 | Attending: Internal Medicine | Admitting: Internal Medicine

## 2022-04-24 ENCOUNTER — Emergency Department (HOSPITAL_COMMUNITY): Payer: 59

## 2022-04-24 ENCOUNTER — Other Ambulatory Visit: Payer: Self-pay

## 2022-04-24 DIAGNOSIS — N289 Disorder of kidney and ureter, unspecified: Secondary | ICD-10-CM

## 2022-04-24 DIAGNOSIS — E669 Obesity, unspecified: Secondary | ICD-10-CM | POA: Diagnosis present

## 2022-04-24 DIAGNOSIS — A419 Sepsis, unspecified organism: Secondary | ICD-10-CM | POA: Diagnosis not present

## 2022-04-24 DIAGNOSIS — R7989 Other specified abnormal findings of blood chemistry: Secondary | ICD-10-CM | POA: Diagnosis present

## 2022-04-24 DIAGNOSIS — J9601 Acute respiratory failure with hypoxia: Secondary | ICD-10-CM | POA: Diagnosis present

## 2022-04-24 DIAGNOSIS — R0902 Hypoxemia: Secondary | ICD-10-CM

## 2022-04-24 DIAGNOSIS — N179 Acute kidney failure, unspecified: Secondary | ICD-10-CM | POA: Diagnosis present

## 2022-04-24 DIAGNOSIS — Z1152 Encounter for screening for COVID-19: Secondary | ICD-10-CM

## 2022-04-24 DIAGNOSIS — F17210 Nicotine dependence, cigarettes, uncomplicated: Secondary | ICD-10-CM | POA: Diagnosis present

## 2022-04-24 DIAGNOSIS — F909 Attention-deficit hyperactivity disorder, unspecified type: Secondary | ICD-10-CM | POA: Diagnosis present

## 2022-04-24 DIAGNOSIS — Z6830 Body mass index (BMI) 30.0-30.9, adult: Secondary | ICD-10-CM

## 2022-04-24 DIAGNOSIS — Z28310 Unvaccinated for covid-19: Secondary | ICD-10-CM

## 2022-04-24 DIAGNOSIS — J189 Pneumonia, unspecified organism: Secondary | ICD-10-CM | POA: Diagnosis present

## 2022-04-24 DIAGNOSIS — R7401 Elevation of levels of liver transaminase levels: Secondary | ICD-10-CM | POA: Diagnosis present

## 2022-04-24 LAB — CBC WITH DIFFERENTIAL/PLATELET
Abs Immature Granulocytes: 0.11 10*3/uL — ABNORMAL HIGH (ref 0.00–0.07)
Basophils Absolute: 0.1 10*3/uL (ref 0.0–0.1)
Basophils Relative: 0 %
Eosinophils Absolute: 0 10*3/uL (ref 0.0–0.5)
Eosinophils Relative: 0 %
HCT: 51.3 % (ref 39.0–52.0)
Hemoglobin: 17.1 g/dL — ABNORMAL HIGH (ref 13.0–17.0)
Immature Granulocytes: 1 %
Lymphocytes Relative: 14 %
Lymphs Abs: 3.1 10*3/uL (ref 0.7–4.0)
MCH: 31.4 pg (ref 26.0–34.0)
MCHC: 33.3 g/dL (ref 30.0–36.0)
MCV: 94.1 fL (ref 80.0–100.0)
Monocytes Absolute: 2.5 10*3/uL — ABNORMAL HIGH (ref 0.1–1.0)
Monocytes Relative: 11 %
Neutro Abs: 17.2 10*3/uL — ABNORMAL HIGH (ref 1.7–7.7)
Neutrophils Relative %: 74 %
Platelets: 281 10*3/uL (ref 150–400)
RBC: 5.45 MIL/uL (ref 4.22–5.81)
RDW: 13.2 % (ref 11.5–15.5)
WBC: 22.9 10*3/uL — ABNORMAL HIGH (ref 4.0–10.5)
nRBC: 0 % (ref 0.0–0.2)

## 2022-04-24 LAB — COMPREHENSIVE METABOLIC PANEL
ALT: 91 U/L — ABNORMAL HIGH (ref 0–44)
AST: 124 U/L — ABNORMAL HIGH (ref 15–41)
Albumin: 3.4 g/dL — ABNORMAL LOW (ref 3.5–5.0)
Alkaline Phosphatase: 104 U/L (ref 38–126)
Anion gap: 11 (ref 5–15)
BUN: 24 mg/dL — ABNORMAL HIGH (ref 6–20)
CO2: 30 mmol/L (ref 22–32)
Calcium: 8.4 mg/dL — ABNORMAL LOW (ref 8.9–10.3)
Chloride: 96 mmol/L — ABNORMAL LOW (ref 98–111)
Creatinine, Ser: 1.5 mg/dL — ABNORMAL HIGH (ref 0.61–1.24)
GFR, Estimated: >60 mL/min (ref >60)
Glucose, Bld: 116 mg/dL — ABNORMAL HIGH (ref 70–99)
Potassium: 4.7 mmol/L (ref 3.5–5.1)
Sodium: 137 mmol/L (ref 135–145)
Total Bilirubin: 0.6 mg/dL (ref 0.3–1.2)
Total Protein: 6.5 g/dL (ref 6.5–8.1)

## 2022-04-24 LAB — TROPONIN I (HIGH SENSITIVITY)
Troponin I (High Sensitivity): 971 ng/L (ref <18)
Troponin I (High Sensitivity): 706 ng/L (ref <18)

## 2022-04-24 LAB — LIPASE, BLOOD: Lipase: 20 U/L (ref 11–51)

## 2022-04-24 LAB — BRAIN NATRIURETIC PEPTIDE: B Natriuretic Peptide: 419.1 pg/mL — ABNORMAL HIGH (ref 0.0–100.0)

## 2022-04-24 LAB — D-DIMER, QUANTITATIVE
D-Dimer, Quant: 2.77 ug/mL-FEU — ABNORMAL HIGH (ref 0.00–0.50)
D-Dimer, Quant: 3.74 ug/mL-FEU — ABNORMAL HIGH (ref 0.00–0.50)

## 2022-04-24 MED ORDER — SODIUM CHLORIDE 0.9 % IV SOLN
1.0000 g | Freq: Once | INTRAVENOUS | Status: AC
  Administered 2022-04-25: 1 g via INTRAVENOUS
  Filled 2022-04-24: qty 10

## 2022-04-24 MED ORDER — SODIUM CHLORIDE 0.9 % IV SOLN
500.0000 mg | Freq: Once | INTRAVENOUS | Status: AC
  Administered 2022-04-25: 500 mg via INTRAVENOUS
  Filled 2022-04-24: qty 5

## 2022-04-24 MED ORDER — IOHEXOL 350 MG/ML SOLN
75.0000 mL | Freq: Once | INTRAVENOUS | Status: AC | PRN
  Administered 2022-04-24: 75 mL via INTRAVENOUS

## 2022-04-24 MED ORDER — ONDANSETRON HCL 4 MG/2ML IJ SOLN
4.0000 mg | Freq: Once | INTRAMUSCULAR | Status: AC
  Administered 2022-04-24: 4 mg via INTRAVENOUS
  Filled 2022-04-24: qty 2

## 2022-04-24 MED ORDER — ASPIRIN 81 MG PO CHEW
324.0000 mg | CHEWABLE_TABLET | Freq: Once | ORAL | Status: AC
  Administered 2022-04-24: 324 mg via ORAL
  Filled 2022-04-24: qty 4

## 2022-04-24 MED ORDER — LACTATED RINGERS IV BOLUS
1000.0000 mL | Freq: Once | INTRAVENOUS | Status: AC
  Administered 2022-04-24: 1000 mL via INTRAVENOUS

## 2022-04-24 NOTE — ED Provider Notes (Signed)
[facility] EMERGENCY DEPARTMENT Provider Note   CSN: [id number] Arrival date & time: 04/24/22  1921     History  Chief Complaint  Patient presents with   Cough    Daniel [name] is a 26 y.o. male.  See PA note for full HPI.  In short this is a healthy 26 year old male presenting to the emergency department with 2 days of cough and 1 day of nausea, vomiting and diarrhea.  He denies any known sick contacts.  He is not vaccinated against COVID.  He denies any recent hospitalizations or surgeries, recent long travels in the car plane, blood thinner use, hormone use, cancer history.   Cough      Home Medications Prior to Admission medications   Not on File      Allergies    Patient has no known allergies.    Review of Systems   Review of Systems  Respiratory:  Positive for cough.     Physical Exam Updated Vital Signs BP (!) 158/100   Pulse 100   Temp 99.4 F (37.4 C)   Resp (!) 28   SpO2 96%  Physical Exam Constitutional:      General: He is not in acute distress.    Appearance: Normal appearance.  HENT:     Head: Normocephalic and atraumatic.     Nose: Nose normal.     Mouth/Throat:     Mouth: Mucous membranes are moist.     Pharynx: Oropharynx is clear.  Eyes:     Extraocular Movements: Extraocular movements intact.     Conjunctiva/sclera: Conjunctivae normal.  Cardiovascular:     Rate and Rhythm: Regular rhythm. Tachycardia present.     Pulses: Normal pulses.     Heart sounds: Normal heart sounds.  Pulmonary:     Effort: Pulmonary effort is normal.     Breath sounds: Rales (Right greater than left) present.  Abdominal:     General: Abdomen is flat.     Palpations: Abdomen is soft.     Tenderness: There is no abdominal tenderness.  Musculoskeletal:        General: Normal range of motion.     Cervical back: Normal range of motion and neck supple.     Right lower leg: No edema.     Left lower leg: No edema.  Skin:     General: Skin is warm and dry.  Neurological:     General: No focal deficit present.     Mental Status: He is alert and oriented to person, place, and time.  Psychiatric:        Mood and Affect: Mood normal.        Behavior: Behavior normal.     ED Results / Procedures / Treatments   Labs (all labs ordered are listed, but only abnormal results are displayed) Labs Reviewed  D-DIMER, QUANTITATIVE - Abnormal; Notable for the following components:      Result Value   D-Dimer, Quant 2.77 (*)    All other components within normal limits  BRAIN NATRIURETIC PEPTIDE - Abnormal; Notable for the following components:   B Natriuretic Peptide 419.1 (*)    All other components within normal limits  CBC WITH DIFFERENTIAL/PLATELET - Abnormal; Notable for the following components:   WBC 22.9 (*)    Hemoglobin 17.1 (*)    Neutro Abs 17.2 (*)    Monocytes Absolute 2.5 (*)    Abs Immature Granulocytes 0.11 (*)    All other components within  normal limits  D-DIMER, QUANTITATIVE - Abnormal; Notable for the following components:   D-Dimer, Quant 3.74 (*)    All other components within normal limits  COMPREHENSIVE METABOLIC PANEL - Abnormal; Notable for the following components:   Chloride 96 (*)    Glucose, Bld 116 (*)    BUN 24 (*)    Creatinine, Ser 1.50 (*)    Calcium 8.4 (*)    Albumin 3.4 (*)    AST 124 (*)    ALT 91 (*)    All other components within normal limits  TROPONIN I (HIGH SENSITIVITY) - Abnormal; Notable for the following components:   Troponin I (High Sensitivity) 971 (*)    All other components within normal limits  TROPONIN I (HIGH SENSITIVITY) - Abnormal; Notable for the following components:   Troponin I (High Sensitivity) 706 (*)    All other components within normal limits  RESP PANEL BY RT-PCR (FLU A&B, COVID) ARPGX2  LIPASE, BLOOD  CBC WITH DIFFERENTIAL/PLATELET    EKG EKG Interpretation  Date/Time:  Thursday April 24 2022 20:15:07 EST Ventricular Rate:   115 PR Interval:  128 QRS Duration: 71 QT Interval:  321 QTC Calculation: 444 R Axis:   49 Text Interpretation: Sinus tachycardia Atrial premature complex Nonspecific T abnormalities, diffuse leads New T-wave inversions in aVF, V3-V4 compared to prior EKG Confirmed by Elayne SnareKingsley, Venetia Prewitt ([id number]) on 04/24/2022 8:24:59 PM  Radiology CT Angio Chest PE W and/or Wo Contrast  Result Date: 04/24/2022 CLINICAL DATA:  Pulmonary embolism (PE) suspected, low to intermediate prob, positive D-dimer Cough. EXAM: CT ANGIOGRAPHY CHEST WITH CONTRAST TECHNIQUE: Multidetector CT imaging of the chest was performed using the standard protocol during bolus administration of intravenous contrast. Multiplanar CT image reconstructions and MIPs were obtained to evaluate the vascular anatomy. RADIATION DOSE REDUCTION: This exam was performed according to the departmental dose-optimization program which includes automated exposure control, adjustment of the mA and/or kV according to patient size and/or use of iterative reconstruction technique. CONTRAST:  75mL OMNIPAQUE IOHEXOL 350 MG/ML SOLN COMPARISON:  Radiograph earlier today. FINDINGS: Cardiovascular: There are no filling defects within the pulmonary arteries to suggest pulmonary embolus. The heart is upper normal in size. Normal thoracic aorta without dissection. No pericardial effusion. Mediastinum/Nodes: Small scattered mediastinal and hilar lymph nodes, typically reactive. No thyroid nodule. Patulous distal esophagus. Lungs/Pleura: Extensive bilateral lung opacities, primarily ground-glass and nodular. There is a upper lobe predominant and right greater than left lung predominance. No significant pleural effusion. Upper Abdomen: No acute or unexpected findings. Musculoskeletal: There are no acute or suspicious osseous abnormalities. No chest wall soft tissue abnormalities. Review of the MIP images confirms the above findings. IMPRESSION: 1. No pulmonary embolus. 2. Extensive  bilateral lung opacities, primarily ground-glass and nodular. Differential considerations include multifocal pneumonia or pulmonary edema. 3. Upper normal heart size. Electronically Signed   By: Narda RutherfordMelanie  Sanford M.D.   On: 04/24/2022 23:42   DG Chest Portable 1 View  Result Date: 04/24/2022 CLINICAL DATA:  Hypoxia. EXAM: PORTABLE CHEST 1 VIEW COMPARISON:  05/12/2019. FINDINGS: The heart size and mediastinal contours are within normal limits. Hazy diffuse airspace disease is present in the lungs, greater on the right than on the left. No effusion or pneumothorax. No acute osseous abnormality. IMPRESSION: Diffuse hazy airspace disease in the lungs bilaterally, greater on the right than on the left, possible edema or pneumonia. Electronically Signed   By: Thornell SartoriusLaura  Taylor M.D.   On: 04/24/2022 20:27    Procedures Ultrasound ED Echo  Date/Time: 04/24/2022 9:13 PM  Performed by: Rexford Maus, DO Authorized by: Rexford Maus, DO   Procedure details:    Indications: dyspnea     Views: subxiphoid, parasternal long axis view, parasternal short axis view, apical 4 chamber view and IVC view     Images: archived   Findings:    Pericardium: no pericardial effusion     LV Function: normal (>50% EF)     RV Diameter: dilated     IVC: dilated   Impression:    Impression comment:  Increased R-sided pressure, though no evidence of RV strain .Critical Care  Performed by: Rexford Maus, DO Authorized by: Rexford Maus, DO   Critical care provider statement:    Critical care time (minutes):  45   Critical care time was exclusive of:  Separately billable procedures and treating other patients   Critical care was necessary to treat or prevent imminent or life-threatening deterioration of the following conditions:  Respiratory failure   Critical care was time spent personally by me on the following activities:  Development of treatment plan with patient or surrogate, discussions  with consultants, evaluation of patient's response to treatment, examination of patient, interpretation of cardiac output measurements, obtaining history from patient or surrogate, ordering and performing treatments and interventions, ordering and review of laboratory studies, ordering and review of radiographic studies, pulse oximetry and re-evaluation of patient's condition   I assumed direction of critical care for this patient from another provider in my specialty: yes     Care discussed with: admitting provider       Medications Ordered in ED Medications  cefTRIAXone (ROCEPHIN) 1 g in sodium chloride 0.9 % 100 mL IVPB (has no administration in time range)  azithromycin (ZITHROMAX) 500 mg in sodium chloride 0.9 % 250 mL IVPB (has no administration in time range)  ondansetron (ZOFRAN) injection 4 mg (4 mg Intravenous Given 04/24/22 2042)  lactated ringers bolus 1,000 mL (0 mLs Intravenous Stopped 04/24/22 2158)  aspirin chewable tablet 324 mg (324 mg Oral Given 04/24/22 2205)  iohexol (OMNIPAQUE) 350 MG/ML injection 75 mL (75 mLs Intravenous Contrast Given 04/24/22 2325)    ED Course/ Medical Decision Making/ A&P Clinical Course as of 04/24/22 2351  Thu Apr 24, 2022  2130 D-dimer is elevated, CTPE will be performed. POCUS performed by myself did appear to have some increased R-sided heart pressure with dilated IVC and increased size of RV without evidence of RV strain. [VK]  2155 Troponin elevated 971, concerning for possible massive PE. CTPE study pending. Patient remains hemodynamically stable. He was given 324 mg chewable aspirin. [VK]  2343 Troponins downtrending. CTPE read pending with plan for admission. [VK]  2350 CT PE study without evidence of PE and again shows concern for multifocal pneumonia versus pulmonary edema.  Due to patient's elevated white count, favoring multifocal pneumonia and will be treated with antibiotics.  BNP is mildly elevated to 400 but no evidence of acute CHF  on bedside POCUS and troponins are downtrending.  Patient will be admitted for further management of his hypoxia in the setting of multifocal pneumonia. [VK]    Clinical Course User Index [VK] Rexford Maus, DO                           Medical Decision Making Amount and/or Complexity of Data Reviewed Labs: ordered. Radiology: ordered.  Risk OTC drugs. Prescription drug management. Decision regarding hospitalization.  Final Clinical Impression(s) / ED Diagnoses Final diagnoses:  Hypoxia  Elevated troponin  Multifocal pneumonia    Rx / DC Orders ED Discharge Orders     None         Rexford Maus, DO 04/24/22 2351

## 2022-04-24 NOTE — ED Triage Notes (Signed)
Pt with cough and excess mucus for 2 days.  Pt reports he feels he is wheezing. No hx of asthma.

## 2022-04-24 NOTE — ED Notes (Signed)
This Clinical research associate validated vitals and upon realizing oxygen was 77%, went to room 6 to check accuracy of O2 reading. Patient oxygen 70-75%. RN in room putting oxygen on patient.

## 2022-04-24 NOTE — ED Provider Notes (Signed)
[facility] EMERGENCY DEPARTMENT Provider Note   CSN: [id number] Arrival date & time: 04/24/22  1921     History  Chief Complaint  Patient presents with   Cough    Daniel [name] is a 26 y.o. male.  Patient with history of ADHD presents today with complaints of cough and congestion. He states that same began throughout the day yesterday and persisted into today. Also states that he has had nausea and vomiting today as well. Has not checked his temperatures but does state that he has felt warm and cold throughout the day today. No known sick contacts. Denies any history of respiratory conditions such as asthma. Denies any chest pain, shortness of breath, leg pain, or leg swelling. No recent travel or recent surgeries, no history of blood clots.  The history is provided by the patient. No language interpreter was used.  Cough      Home Medications Prior to Admission medications   Not on File      Allergies    Patient has no known allergies.    Review of Systems   Review of Systems  Respiratory:  Positive for cough.   All other systems reviewed and are negative.   Physical Exam Updated Vital Signs BP (!) 130/93 (BP Location: Right Arm)   Pulse (!) 121   Temp 99.4 F (37.4 C)   Resp (!) 22   SpO2 (!) 87%  Physical Exam Vitals and nursing note reviewed.  Constitutional:      General: He is not in acute distress.    Appearance: Normal appearance. He is normal weight. He is not ill-appearing, toxic-appearing or diaphoretic.  HENT:     Head: Normocephalic and atraumatic.  Cardiovascular:     Rate and Rhythm: Normal rate and regular rhythm.     Heart sounds: Normal heart sounds.  Pulmonary:     Effort: Pulmonary effort is normal.     Breath sounds: Rales present.     Comments: On 4L [place] Abdominal:     General: Abdomen is flat.     Palpations: Abdomen is soft.  Musculoskeletal:        General: Normal range of motion.     Cervical  back: Normal range of motion.  Skin:    General: Skin is warm and dry.  Neurological:     General: No focal deficit present.     Mental Status: He is alert.  Psychiatric:        Mood and Affect: Mood normal.        Behavior: Behavior normal.     ED Results / Procedures / Treatments   Labs (all labs ordered are listed, but only abnormal results are displayed) Labs Reviewed  RESP PANEL BY RT-PCR (FLU A&B, COVID) [id number]  D-DIMER, QUANTITATIVE  CBC WITH DIFFERENTIAL/PLATELET  COMPREHENSIVE METABOLIC PANEL  LIPASE, BLOOD  BRAIN NATRIURETIC PEPTIDE  TROPONIN I (HIGH SENSITIVITY)    EKG None  Radiology DG Chest Portable 1 View  Result Date: 04/24/2022 CLINICAL DATA:  Hypoxia. EXAM: PORTABLE CHEST 1 VIEW COMPARISON:  05/12/2019. FINDINGS: The heart size and mediastinal contours are within normal limits. Hazy diffuse airspace disease is present in the lungs, greater on the right than on the left. No effusion or pneumothorax. No acute osseous abnormality. IMPRESSION: Diffuse hazy airspace disease in the lungs bilaterally, greater on the right than on the left, possible edema or pneumonia. Electronically Signed   By: Thornell Sartorius M.D.   On: 04/24/2022 20:27  Procedures .Critical Care  Performed by: Silva Bandy, PA-C Authorized by: Silva Bandy, PA-C   Critical care provider statement:    Critical care time (minutes):  35   Critical care start time:  04/24/2022 8:30 PM   Critical care end time:  04/24/2022 9:05 PM   Critical care was necessary to treat or prevent imminent or life-threatening deterioration of the following conditions:  Respiratory failure   Critical care was time spent personally by me on the following activities:  Development of treatment plan with patient or surrogate, examination of patient, obtaining history from patient or surrogate, pulse oximetry, ordering and review of radiographic studies, ordering and review of laboratory studies and re-evaluation of  patient's condition     Medications Ordered in ED Medications - No data to display  ED Course/ Medical Decision Making/ A&P Clinical Course as of 04/26/22 1731  Thu Apr 24, 2022  2130 D-dimer is elevated, CTPE will be performed. POCUS performed by myself did appear to have some increased R-sided heart pressure with dilated IVC and increased size of RV without evidence of RV strain. [VK]  2155 Troponin elevated 971, concerning for possible massive PE. CTPE study pending. Patient remains hemodynamically stable. He was given 324 mg chewable aspirin. [VK]  2343 Troponins downtrending. CTPE read pending with plan for admission. [VK]  2350 CT PE study without evidence of PE and again shows concern for multifocal pneumonia versus pulmonary edema.  Due to patient's elevated white count, favoring multifocal pneumonia and will be treated with antibiotics.  BNP is mildly elevated to 400 but no evidence of acute CHF on bedside POCUS and troponins are downtrending.  Patient will be admitted for further management of his hypoxia in the setting of multifocal pneumonia. [VK]  Fri Apr 25, 2022  0005 Patient signed out to Dr. Preston Fleeting pending hospitalist admission. [VK]    Clinical Course User Index [VK] Rexford Maus, DO                           Medical Decision Making Amount and/or Complexity of Data Reviewed Labs: ordered. Radiology: ordered.  Risk OTC drugs. Prescription drug management. Decision regarding hospitalization.   This patient is a 26 y.o. male who presents to the ED for concern of cough and congestion, this involves an extensive number of treatment options, and is a complaint that carries with it a high risk of complications and morbidity. The emergent differential diagnosis prior to evaluation includes, but is not limited to,  CHF, pericardial effusion/tamponade, arrhythmias, ACS, COPD, asthma, bronchitis, pneumonia, pneumothorax, PE, anemia  .   This is not an exhaustive  differential.   Past Medical History / Co-morbidities / Social History: none  Physical Exam: Physical exam performed. The pertinent findings include: patient hypoxic in the 70s, improved to 80s on 2L, improved to 90s on 4 L [place].  Lab Tests: I ordered, and personally interpreted labs.  Obtained d-dimer, cbc, bnp, COVID, Flu swab, CMP, Lipase, troponin   Imaging Studies: I ordered imaging studies including CXR. I independently visualized and interpreted imaging which showed   Diffuse hazy airspace disease in the lungs bilaterally, greater on the right than on the left, possible edema or pneumonia.  I agree with the radiologist interpretation.   Cardiac Monitoring:  The patient was maintained on a cardiac monitor.  My attending physician Dr. Theresia Lo viewed and interpreted the cardiac monitored which showed an underlying rhythm of: sinus tachycardia, no STEMI.  I agree with this interpretation.    Disposition:  Patient presents today with complaints of cough and congestion. Found to be hypoxic in the 70s on room air with good pleth on several different monitors. CXR shows possible new onset heart failure vs infection. Laboratory evaluation pending. Will need admission.    Care handoff to Dr. Theresia Lo at shift change. Please see their note for further evaluation and dispo   Final Clinical Impression(s) / ED Diagnoses Final diagnoses:  None    Rx / DC Orders ED Discharge Orders     None         Silva Bandy, PA-C 04/26/22 1732    Rexford Maus, DO 04/26/22 2322

## 2022-04-25 ENCOUNTER — Inpatient Hospital Stay (HOSPITAL_COMMUNITY): Payer: 59

## 2022-04-25 DIAGNOSIS — J189 Pneumonia, unspecified organism: Secondary | ICD-10-CM

## 2022-04-25 DIAGNOSIS — Z1152 Encounter for screening for COVID-19: Secondary | ICD-10-CM | POA: Diagnosis not present

## 2022-04-25 DIAGNOSIS — A419 Sepsis, unspecified organism: Secondary | ICD-10-CM | POA: Diagnosis present

## 2022-04-25 DIAGNOSIS — F17210 Nicotine dependence, cigarettes, uncomplicated: Secondary | ICD-10-CM | POA: Diagnosis present

## 2022-04-25 DIAGNOSIS — R7401 Elevation of levels of liver transaminase levels: Secondary | ICD-10-CM | POA: Diagnosis present

## 2022-04-25 DIAGNOSIS — F909 Attention-deficit hyperactivity disorder, unspecified type: Secondary | ICD-10-CM | POA: Diagnosis present

## 2022-04-25 DIAGNOSIS — N289 Disorder of kidney and ureter, unspecified: Secondary | ICD-10-CM | POA: Diagnosis present

## 2022-04-25 DIAGNOSIS — N179 Acute kidney failure, unspecified: Secondary | ICD-10-CM | POA: Diagnosis present

## 2022-04-25 DIAGNOSIS — Z6830 Body mass index (BMI) 30.0-30.9, adult: Secondary | ICD-10-CM | POA: Diagnosis not present

## 2022-04-25 DIAGNOSIS — E669 Obesity, unspecified: Secondary | ICD-10-CM | POA: Diagnosis present

## 2022-04-25 DIAGNOSIS — R7989 Other specified abnormal findings of blood chemistry: Secondary | ICD-10-CM | POA: Diagnosis not present

## 2022-04-25 DIAGNOSIS — R0609 Other forms of dyspnea: Secondary | ICD-10-CM

## 2022-04-25 DIAGNOSIS — J9601 Acute respiratory failure with hypoxia: Secondary | ICD-10-CM | POA: Diagnosis present

## 2022-04-25 DIAGNOSIS — Z28310 Unvaccinated for covid-19: Secondary | ICD-10-CM | POA: Diagnosis not present

## 2022-04-25 LAB — COMPREHENSIVE METABOLIC PANEL
ALT: 97 U/L — ABNORMAL HIGH (ref 0–44)
AST: 116 U/L — ABNORMAL HIGH (ref 15–41)
Albumin: 3.4 g/dL — ABNORMAL LOW (ref 3.5–5.0)
Alkaline Phosphatase: 104 U/L (ref 38–126)
Anion gap: 14 (ref 5–15)
BUN: 21 mg/dL — ABNORMAL HIGH (ref 6–20)
CO2: 30 mmol/L (ref 22–32)
Calcium: 8.8 mg/dL — ABNORMAL LOW (ref 8.9–10.3)
Chloride: 95 mmol/L — ABNORMAL LOW (ref 98–111)
Creatinine, Ser: 1.29 mg/dL — ABNORMAL HIGH (ref 0.61–1.24)
GFR, Estimated: >60 mL/min (ref >60)
Glucose, Bld: 135 mg/dL — ABNORMAL HIGH (ref 70–99)
Potassium: 4.5 mmol/L (ref 3.5–5.1)
Sodium: 139 mmol/L (ref 135–145)
Total Bilirubin: 0.6 mg/dL (ref 0.3–1.2)
Total Protein: 6.7 g/dL (ref 6.5–8.1)

## 2022-04-25 LAB — URINALYSIS, ROUTINE W REFLEX MICROSCOPIC
Bacteria, UA: NONE SEEN
Bilirubin Urine: NEGATIVE
Glucose, UA: NEGATIVE mg/dL
Hgb urine dipstick: NEGATIVE
Ketones, ur: 5 mg/dL — AB
Leukocytes,Ua: NEGATIVE
Nitrite: NEGATIVE
Protein, ur: 30 mg/dL — AB
Specific Gravity, Urine: 1.04 — ABNORMAL HIGH (ref 1.005–1.030)
pH: 6 (ref 5.0–8.0)

## 2022-04-25 LAB — RESPIRATORY PANEL BY PCR

## 2022-04-25 LAB — RAPID URINE DRUG SCREEN, HOSP PERFORMED
Amphetamines: NOT DETECTED
Barbiturates: NOT DETECTED
Benzodiazepines: NOT DETECTED
Cocaine: NOT DETECTED
Opiates: NOT DETECTED
Tetrahydrocannabinol: NOT DETECTED

## 2022-04-25 LAB — CBC
HCT: 49.9 % (ref 39.0–52.0)
Hemoglobin: 16.9 g/dL (ref 13.0–17.0)
MCH: 31.9 pg (ref 26.0–34.0)
MCHC: 33.9 g/dL (ref 30.0–36.0)
MCV: 94.2 fL (ref 80.0–100.0)
Platelets: 278 10*3/uL (ref 150–400)
RBC: 5.3 MIL/uL (ref 4.22–5.81)
RDW: 13.2 % (ref 11.5–15.5)
WBC: 22.6 10*3/uL — ABNORMAL HIGH (ref 4.0–10.5)
nRBC: 0 % (ref 0.0–0.2)

## 2022-04-25 LAB — PROCALCITONIN: Procalcitonin: 0.21 ng/mL

## 2022-04-25 LAB — SEDIMENTATION RATE: Sed Rate: 4 mm/hr (ref 0–16)

## 2022-04-25 LAB — HIV ANTIBODY (ROUTINE TESTING W REFLEX): HIV Screen 4th Generation wRfx: NONREACTIVE

## 2022-04-25 LAB — C-REACTIVE PROTEIN: CRP: 7 mg/dL — ABNORMAL HIGH (ref <1.0)

## 2022-04-25 LAB — RESP PANEL BY RT-PCR (FLU A&B, COVID) ARPGX2
Influenza A by PCR: NEGATIVE
Influenza B by PCR: NEGATIVE
SARS Coronavirus 2 by RT PCR: NEGATIVE

## 2022-04-25 LAB — ECHOCARDIOGRAM COMPLETE
S' Lateral: 3 cm
Single Plane A4C EF: 51.4 %

## 2022-04-25 LAB — STREP PNEUMONIAE URINARY ANTIGEN: Strep Pneumo Urinary Antigen: NEGATIVE

## 2022-04-25 MED ORDER — ENOXAPARIN SODIUM 40 MG/0.4ML IJ SOSY
40.0000 mg | PREFILLED_SYRINGE | INTRAMUSCULAR | Status: DC
  Administered 2022-04-25 – 2022-04-28 (×4): 40 mg via SUBCUTANEOUS
  Filled 2022-04-25 (×5): qty 0.4

## 2022-04-25 MED ORDER — ACETAMINOPHEN 325 MG PO TABS: 650.0000 mg | ORAL_TABLET | Freq: Four times a day (QID) | ORAL | Status: DC | PRN

## 2022-04-25 MED ORDER — SODIUM CHLORIDE 0.9 % IV SOLN
500.0000 mg | INTRAVENOUS | Status: DC
  Administered 2022-04-25 – 2022-04-26 (×2): 500 mg via INTRAVENOUS
  Filled 2022-04-25 (×3): qty 5

## 2022-04-25 MED ORDER — SENNOSIDES-DOCUSATE SODIUM 8.6-50 MG PO TABS
1.0000 | ORAL_TABLET | Freq: Every evening | ORAL | Status: DC | PRN
  Administered 2022-04-25 – 2022-04-26 (×2): 1 via ORAL
  Filled 2022-04-25 (×2): qty 1

## 2022-04-25 MED ORDER — ACETAMINOPHEN 650 MG RE SUPP: 650.0000 mg | Freq: Four times a day (QID) | RECTAL | Status: DC | PRN

## 2022-04-25 MED ORDER — SODIUM CHLORIDE 0.9% FLUSH
3.0000 mL | Freq: Two times a day (BID) | INTRAVENOUS | Status: DC
  Administered 2022-04-25 – 2022-04-28 (×8): 3 mL via INTRAVENOUS

## 2022-04-25 MED ORDER — SODIUM CHLORIDE 0.9 % IV SOLN
2.0000 g | INTRAVENOUS | Status: DC
  Administered 2022-04-25 – 2022-04-27 (×3): 2 g via INTRAVENOUS
  Filled 2022-04-25 (×3): qty 20

## 2022-04-25 MED ORDER — ONDANSETRON HCL 4 MG PO TABS: 4.0000 mg | ORAL_TABLET | Freq: Four times a day (QID) | ORAL | Status: DC | PRN

## 2022-04-25 MED ORDER — ONDANSETRON HCL 4 MG/2ML IJ SOLN: 4.0000 mg | Freq: Four times a day (QID) | INTRAMUSCULAR | Status: DC | PRN

## 2022-04-25 NOTE — Assessment & Plan Note (Signed)
Presenting with leukocytosis, tachycardia, and extensive infiltrates throughout bilateral lung fields on CT imaging concerning for multifocal pneumonia.  COVID and influenza negative. -Continue empiric IV ceftriaxone and azithromycin -Obtain blood cultures -Obtain full RVP panel, strep pneumonia urinary antigen, ESR, CRP

## 2022-04-25 NOTE — ED Provider Notes (Signed)
Case is discussed with Dr. Allena Katz of [facility] who agrees to admit the patient.   Dione Booze, MD 04/25/22 [phone]

## 2022-04-25 NOTE — Progress Notes (Signed)
  Echocardiogram 2D Echocardiogram has been performed.  Delcie Roch 04/25/2022, 12:41 PM

## 2022-04-25 NOTE — ED Notes (Signed)
Echo at bedside

## 2022-04-25 NOTE — Progress Notes (Signed)
[facility] PROGRESS NOTE   LITTLETON HAUB QIW:[id number] DOB: [birth year] DOA: 04/24/2022  PCP: Maury Dus, MD  Brief History/Interval Summary: 26 y.o. male with medical history significant for ADHD who presented to the ED for evaluation of cough.  Patient was found to have multifocal pneumonia.  He was noted to be hypoxic with saturations in the mid 70s on room air.  He was hospitalized for further management.  Consultants: None  Procedures: None    Subjective/Interval History: Patient mentioned that he is feeling slightly better today compared to last night.  Less short of breath.  Still has occasional cough which is dry for the most part.  No chest pain.  No nausea or vomiting.  Denies any abdominal pain.    Assessment/Plan:  Community-acquired pneumonia/sepsis present on admission Patient met sepsis criteria at admission with leukocytosis tachycardia findings of multifocal pneumonia on imaging studies. COVID-19 and influenza PCR negative.  Respiratory viral panel is negative.  Strep pneumoniae urinary antigen is pending. Follow-up on blood cultures. Continue with ceftriaxone and azithromycin.  Acute respiratory failure with hypoxia Secondary to pneumonia.  Currently on 4 L of oxygen by nasal cannula.  Saturations in the mid to late 90s.  Start weaning down oxygen. CT angiogram was negative for PE.   Incentive spirometry.  Elevated troponin Patient denies any chest pain.  EKG shows nonspecific changes.  BNP was noted to be mildly elevated.  Follow-up on echocardiogram.  Could be demand ischemia.  Acute kidney injury Likely prerenal.  Was given IV fluids in the emergency department.  Improvement in creatinine noted.  Monitor urine output.  Recheck labs tomorrow.  Transaminitis Mildly elevated AST and ALT level noted.  Possibly related to sepsis.  Patient denies any alcohol consumption.  Check hepatitis panel.  Obesity Estimated body mass index is  30.17 kg/m as calculated from the following:   Height as of 10/06/19: _0  (1.88 m).   Weight as of 10/06/19: 106.6 kg.  DVT Prophylaxis: Lovenox Code Status: Full code Family Communication: Discussed with patient Disposition Plan: Hopefully return home when improved  Status is: Inpatient Remains inpatient appropriate because: Acute respiratory failure with hypoxia      Medications: Scheduled:  enoxaparin (LOVENOX) injection  40 mg Subcutaneous Q24H   sodium chloride flush  3 mL Intravenous Q12H   Continuous:  azithromycin 500 mg (04/25/22 0901)   cefTRIAXone (ROCEPHIN)  IV 2 g (04/25/22 0901)   ERD:EYCXKGYJEHUDJ **OR** acetaminophen, ondansetron **OR** ondansetron (ZOFRAN) IV, senna-docusate  Antibiotics: Anti-infectives (From admission, onward)    Start     Dose/Rate Route Frequency Ordered Stop   04/25/22 1000  azithromycin (ZITHROMAX) 500 mg in sodium chloride 0.9 % 250 mL IVPB        500 mg 250 mL/hr over 60 Minutes Intravenous Every 24 hours 04/25/22 0123     04/25/22 1000  cefTRIAXone (ROCEPHIN) 2 g in sodium chloride 0.9 % 100 mL IVPB        2 g 200 mL/hr over 30 Minutes Intravenous Every 24 hours 04/25/22 0123     04/25/22 0000  cefTRIAXone (ROCEPHIN) 1 g in sodium chloride 0.9 % 100 mL IVPB        1 g 200 mL/hr over 30 Minutes Intravenous  Once 04/24/22 2346 04/25/22 0049   04/25/22 0000  azithromycin (ZITHROMAX) 500 mg in sodium chloride 0.9 % 250 mL IVPB        500 mg 250 mL/hr over 60 Minutes Intravenous  Once 04/24/22 2346 04/25/22 0120  Objective:  Vital Signs  Vitals:   04/25/22 0800 04/25/22 0830 04/25/22 0857 04/25/22 0900  BP: (!) 140/98 137/89  (!) 137/94  Pulse: 92 94  92  Resp: (!) 26 (!) 25  18  Temp:   98.1 F (36.7 C)   TempSrc:   Oral   SpO2: 92% 95%  95%   No intake or output data in the 24 hours ending 04/25/22 0916 There were no vitals filed for this visit.  General appearance: Awake alert.  In no distress Resp: Mildly  tachypneic at rest.  No use of accessory muscles.  Few crackles bilaterally.  No wheezing or rhonchi. Cardio: S1-S2 is normal regular.  No S3-S4.  No rubs murmurs or bruit GI: Abdomen is soft.  Nontender nondistended.  Bowel sounds are present normal.  No masses organomegaly Extremities: No edema.  Full range of motion of lower extremities. Neurologic: Alert and oriented x3.  No focal neurological deficits.    Lab Results:  Data Reviewed: I have personally reviewed following labs and reports of the imaging studies  CBC: Recent Labs  Lab 04/24/22 2145 04/25/22 0325  WBC 22.9* 22.6*  NEUTROABS 17.2*  --   HGB 17.1* 16.9  HCT 51.3 49.9  MCV 94.1 94.2  PLT 281 407    Basic Metabolic Panel: Recent Labs  Lab 04/24/22 2145 04/25/22 0325  NA 137 139  K 4.7 4.5  CL 96* 95*  CO2 30 30  GLUCOSE 116* 135*  BUN 24* 21*  CREATININE 1.50* 1.29*  CALCIUM 8.4* 8.8*    GFR: CrCl cannot be calculated (Unknown ideal weight.).  Liver Function Tests: Recent Labs  Lab 04/24/22 2145 04/25/22 0325  AST 124* 116*  ALT 91* 97*  ALKPHOS 104 104  BILITOT 0.6 0.6  PROT 6.5 6.7  ALBUMIN 3.4* 3.4*    Recent Labs  Lab 04/24/22 2145  LIPASE 20     Recent Results (from the past 240 hour(s))  Resp Panel by RT-PCR (Flu A&B, Covid) Anterior Nasal Swab     Status: None   Collection Time: 04/24/22  8:25 PM   Specimen: Anterior Nasal Swab  Result Value Ref Range Status   SARS Coronavirus 2 by RT PCR NEGATIVE NEGATIVE Final    Comment: (NOTE) SARS-CoV-2 target nucleic acids are NOT DETECTED.  The SARS-CoV-2 RNA is generally detectable in upper respiratory specimens during the acute phase of infection. The lowest concentration of SARS-CoV-2 viral copies this assay can detect is 138 copies/mL. A negative result does not preclude SARS-Cov-2 infection and should not be used as the sole basis for treatment or other patient management decisions. A negative result may occur with  improper  specimen collection/handling, submission of specimen other than nasopharyngeal swab, presence of viral mutation(s) within the areas targeted by this assay, and inadequate number of viral copies(<138 copies/mL). A negative result must be combined with clinical observations, patient history, and epidemiological information. The expected result is Negative.  Fact Sheet for Patients:  EntrepreneurPulse.com.au  Fact Sheet for Healthcare Providers:  IncredibleEmployment.be  This test is no t yet approved or cleared by the [place] FDA and  has been authorized for detection and/or diagnosis of SARS-CoV-2 by FDA under an Emergency Use Authorization (EUA). This EUA will remain  in effect (meaning this test can be used) for the duration of the COVID-19 declaration under Section 564(b)(1) of the Act, 21 U.S.C.section 360bbb-3(b)(1), unless the authorization is terminated  or revoked sooner.       Influenza A  by PCR NEGATIVE NEGATIVE Final   Influenza B by PCR NEGATIVE NEGATIVE Final    Comment: (NOTE) The Xpert Xpress SARS-CoV-2/FLU/RSV plus assay is intended as an aid in the diagnosis of influenza from Nasopharyngeal swab specimens and should not be used as a sole basis for treatment. Nasal washings and aspirates are unacceptable for Xpert Xpress SARS-CoV-2/FLU/RSV testing.  Fact Sheet for Patients: EntrepreneurPulse.com.au  Fact Sheet for Healthcare Providers: IncredibleEmployment.be  This test is not yet approved or cleared by the [place] FDA and has been authorized for detection and/or diagnosis of SARS-CoV-2 by FDA under an Emergency Use Authorization (EUA). This EUA will remain in effect (meaning this test can be used) for the duration of the COVID-19 declaration under Section 564(b)(1) of the Act, 21 U.S.C. section 360bbb-3(b)(1), unless the authorization is terminated or revoked.  Performed at  [facility], [place] [address] [place], [place] [address]   Respiratory (~20 pathogens) panel by PCR     Status: None   Collection Time: 04/25/22  1:22 AM   Specimen: Nasopharyngeal Swab; Respiratory  Result Value Ref Range Status   Adenovirus NOT DETECTED NOT DETECTED Final   Coronavirus 229E NOT DETECTED NOT DETECTED Final    Comment: (NOTE) The Coronavirus on the Respiratory Panel, DOES NOT test for the novel  Coronavirus (2019 nCoV)    Coronavirus HKU1 NOT DETECTED NOT DETECTED Final   Coronavirus NL63 NOT DETECTED NOT DETECTED Final   Coronavirus OC43 NOT DETECTED NOT DETECTED Final   Metapneumovirus NOT DETECTED NOT DETECTED Final   Rhinovirus / Enterovirus NOT DETECTED NOT DETECTED Final   Influenza A NOT DETECTED NOT DETECTED Final   Influenza B NOT DETECTED NOT DETECTED Final   Parainfluenza Virus 1 NOT DETECTED NOT DETECTED Final   Parainfluenza Virus 2 NOT DETECTED NOT DETECTED Final   Parainfluenza Virus 3 NOT DETECTED NOT DETECTED Final   Parainfluenza Virus 4 NOT DETECTED NOT DETECTED Final   Respiratory Syncytial Virus NOT DETECTED NOT DETECTED Final   Bordetella pertussis NOT DETECTED NOT DETECTED Final   Bordetella Parapertussis NOT DETECTED NOT DETECTED Final   Chlamydophila pneumoniae NOT DETECTED NOT DETECTED Final   Mycoplasma pneumoniae NOT DETECTED NOT DETECTED Final    Comment: Performed at [facility], [place]. [address], [place], [place] [address]  Culture, blood (Routine X 2) w Reflex to ID Panel     Status: None (Preliminary result)   Collection Time: 04/25/22  3:25 AM   Specimen: BLOOD RIGHT FOREARM  Result Value Ref Range Status   Specimen Description BLOOD RIGHT FOREARM  Final   Special Requests   Final    BOTTLES DRAWN AEROBIC AND ANAEROBIC Blood Culture adequate volume   Culture   Final    NO GROWTH < 12 HOURS Performed at [facility], [place] [address], [place], [place] [address]    Report Status PENDING  Incomplete       Radiology Studies: CT Angio Chest PE W and/or Wo Contrast  Result Date: 04/24/2022 CLINICAL DATA:  Pulmonary embolism (PE) suspected, low to intermediate prob, positive D-dimer Cough. EXAM: CT ANGIOGRAPHY CHEST WITH CONTRAST TECHNIQUE: Multidetector CT imaging of the chest was performed using the standard protocol during bolus administration of intravenous contrast. Multiplanar CT image reconstructions and MIPs were obtained to evaluate the vascular anatomy. RADIATION DOSE REDUCTION: This exam was performed according to the departmental dose-optimization program which includes automated exposure control, adjustment of the mA and/or kV according to patient size and/or use  of iterative reconstruction technique. CONTRAST:  61m OMNIPAQUE IOHEXOL 350 MG/ML SOLN COMPARISON:  Radiograph earlier today. FINDINGS: Cardiovascular: There are no filling defects within the pulmonary arteries to suggest pulmonary embolus. The heart is upper normal in size. Normal thoracic aorta without dissection. No pericardial effusion. Mediastinum/Nodes: Small scattered mediastinal and hilar lymph nodes, typically reactive. No thyroid nodule. Patulous distal esophagus. Lungs/Pleura: Extensive bilateral lung opacities, primarily ground-glass and nodular. There is a upper lobe predominant and right greater than left lung predominance. No significant pleural effusion. Upper Abdomen: No acute or unexpected findings. Musculoskeletal: There are no acute or suspicious osseous abnormalities. No chest wall soft tissue abnormalities. Review of the MIP images confirms the above findings. IMPRESSION: 1. No pulmonary embolus. 2. Extensive bilateral lung opacities, primarily ground-glass and nodular. Differential considerations include multifocal pneumonia or pulmonary edema. 3. Upper normal heart size. Electronically Signed   By: MKeith RakeM.D.   On: 04/24/2022 23:42   DG Chest Portable 1 View  Result Date: 04/24/2022 CLINICAL DATA:   Hypoxia. EXAM: PORTABLE CHEST 1 VIEW COMPARISON:  05/12/2019. FINDINGS: The heart size and mediastinal contours are within normal limits. Hazy diffuse airspace disease is present in the lungs, greater on the right than on the left. No effusion or pneumothorax. No acute osseous abnormality. IMPRESSION: Diffuse hazy airspace disease in the lungs bilaterally, greater on the right than on the left, possible edema or pneumonia. Electronically Signed   By: LBrett FairyM.D.   On: 04/24/2022 20:27       LOS: 0 days   [facility] Pager on [email or web address]  04/25/2022, 9:16 AM

## 2022-04-25 NOTE — Assessment & Plan Note (Signed)
In setting of sepsis.  Creatinine 1.50 on admission compared to previous 1.04 in 2021. -S/p 1 L LR in the ED -Hold further fluids and repeat labs in a.m.

## 2022-04-25 NOTE — ED Notes (Signed)
ED TO INPATIENT HANDOFF REPORT  ED Nurse Name and Phone #: Joaquin Courts [phone]   S Name/Age/Gender Daniel [name] 26 y.o. male Room/Bed: 006C/006C  Code Status   Code Status: Full Code  Home/SNF/Other Home Patient oriented to: self, place, time, and situation Is this baseline? Yes   Triage Complete: Triage complete  Chief Complaint Sepsis due to pneumonia (HCC) [J18.9, A41.9]  Triage Note Pt with cough and excess mucus for 2 days.  Pt reports he feels he is wheezing. No hx of asthma.    Allergies No Known Allergies  Level of Care/Admitting Diagnosis ED Disposition     ED Disposition  Admit   Condition  --   Comment  Hospital Area: [facility] [[id number]]  Level of Care: Progressive [102]  Admit to Progressive based on following criteria: MULTISYSTEM THREATS such as stable sepsis, metabolic/electrolyte imbalance with or without encephalopathy that is responding to early treatment.  May admit patient to [facility] or [facility] if equivalent level of care is available:: No  Covid Evaluation: Confirmed COVID Negative  Diagnosis: Sepsis due to pneumonia [facility]) [[id number]]  Admitting Physician: Charlsie Quest [[id number]]  Attending Physician: Charlsie Quest [[id number]]  Certification:: I certify this patient will need inpatient services for at least 2 midnights  Estimated Length of Stay: 3          B Medical/Surgery History Past Medical History:  Diagnosis Date   ADHD (attention deficit hyperactivity disorder)    No past surgical history on file.   A IV Location/Drains/Wounds Patient Lines/Drains/Airways Status     Active Line/Drains/Airways     Name Placement date Placement time Site Days   Peripheral IV 04/24/22 20 G Distal;Left;Posterior Forearm 04/24/22  2035  Forearm  1   Peripheral IV 04/24/22 18 G Right Antecubital 04/24/22  2149  Antecubital  1            Intake/Output Last 24 hours  Intake/Output Summary (Last 24  hours) at 04/25/2022 1528 Last data filed at 04/25/2022 1356 Gross per 24 hour  Intake --  Output 650 ml  Net -650 ml    Labs/Imaging Results for orders placed or performed during the hospital encounter of 04/24/22 (from the past 48 hour(s))  D-dimer, quantitative     Status: Abnormal   Collection Time: 04/24/22  8:07 PM  Result Value Ref Range   D-Dimer, Quant 2.77 (H) 0.00 - 0.50 ug/mL-FEU    Comment: SPECIMEN CLOTTED LETOYA MILES RN 04/24/2022 2141 BTAYLOR (NOTE) At the manufacturer cut-off value of 0.5 g/mL FEU, this assay has a negative predictive value of 95-100%.This assay is intended for use in conjunction with a clinical pretest probability (PTP) assessment model to exclude pulmonary embolism (PE) and deep venous thrombosis (DVT) in outpatients suspected of PE or DVT. Results should be correlated with clinical presentation. Performed at [facility], [address], [place], [place] [address]   Resp Panel by RT-PCR (Flu A&B, Covid) Anterior Nasal Swab     Status: None   Collection Time: 04/24/22  8:25 PM   Specimen: Anterior Nasal Swab  Result Value Ref Range   SARS Coronavirus 2 by RT PCR NEGATIVE NEGATIVE    Comment: (NOTE) SARS-CoV-2 target nucleic acids are NOT DETECTED.  The SARS-CoV-2 RNA is generally detectable in upper respiratory specimens during the acute phase of infection. The lowest concentration of SARS-CoV-2 viral copies this assay can detect is 138 copies/mL. A negative result does not preclude SARS-Cov-2 infection and  should not be used as the sole basis for treatment or other patient management decisions. A negative result may occur with  improper specimen collection/handling, submission of specimen other than nasopharyngeal swab, presence of viral mutation(s) within the areas targeted by this assay, and inadequate number of viral copies(<138 copies/mL). A negative result must be combined with clinical observations, patient history, and  epidemiological information. The expected result is Negative.  Fact Sheet for Patients:  BloggerCourse.com  Fact Sheet for Healthcare Providers:  SeriousBroker.it  This test is no t yet approved or cleared by the [place] FDA and  has been authorized for detection and/or diagnosis of SARS-CoV-2 by FDA under an Emergency Use Authorization (EUA). This EUA will remain  in effect (meaning this test can be used) for the duration of the COVID-19 declaration under Section 564(b)(1) of the Act, 21 U.S.C.section 360bbb-3(b)(1), unless the authorization is terminated  or revoked sooner.       Influenza A by PCR NEGATIVE NEGATIVE   Influenza B by PCR NEGATIVE NEGATIVE    Comment: (NOTE) The Xpert Xpress SARS-CoV-2/FLU/RSV plus assay is intended as an aid in the diagnosis of influenza from Nasopharyngeal swab specimens and should not be used as a sole basis for treatment. Nasal washings and aspirates are unacceptable for Xpert Xpress SARS-CoV-2/FLU/RSV testing.  Fact Sheet for Patients: BloggerCourse.com  Fact Sheet for Healthcare Providers: SeriousBroker.it  This test is not yet approved or cleared by the [place] FDA and has been authorized for detection and/or diagnosis of SARS-CoV-2 by FDA under an Emergency Use Authorization (EUA). This EUA will remain in effect (meaning this test can be used) for the duration of the COVID-19 declaration under Section 564(b)(1) of the Act, 21 U.S.C. section 360bbb-3(b)(1), unless the authorization is terminated or revoked.  Performed at [facility], [address] [place], [place] [address]   Troponin I (High Sensitivity)     Status: Abnormal   Collection Time: 04/24/22  8:44 PM  Result Value Ref Range   Troponin I (High Sensitivity) 971 (HH) <18 ng/L    Comment: CRITICAL RESULT CALLED TO, READ BACK BY AND VERIFIED WITH LATOYA  MYLES RN 04/24/22 2151 M KOROLESKI (NOTE) Elevated high sensitivity troponin I (hsTnI) values and significant  changes across serial measurements may suggest ACS but many other  chronic and acute conditions are known to elevate hsTnI results.  Refer to the "Links" section for chest pain algorithms and additional  guidance. Performed at [facility], [address] [place], [place] [address]   Brain natriuretic peptide     Status: Abnormal   Collection Time: 04/24/22  9:45 PM  Result Value Ref Range   B Natriuretic Peptide 419.1 (H) 0.0 - 100.0 pg/mL    Comment: Performed at [facility], [address] [place], [place] [address]  CBC with Differential/Platelet     Status: Abnormal   Collection Time: 04/24/22  9:45 PM  Result Value Ref Range   WBC 22.9 (H) 4.0 - 10.5 K/uL   RBC 5.45 4.22 - 5.81 MIL/uL   Hemoglobin 17.1 (H) 13.0 - 17.0 g/dL   HCT 76.1 60.7 - 37.1 %   MCV 94.1 80.0 - 100.0 fL   MCH 31.4 26.0 - 34.0 pg   MCHC 33.3 30.0 - 36.0 g/dL   RDW 06.2 69.4 - 85.4 %   Platelets 281 150 - 400 K/uL   nRBC 0.0 0.0 - 0.2 %   Neutrophils Relative % 74 %  Neutro Abs 17.2 (H) 1.7 - 7.7 K/uL   Lymphocytes Relative 14 %   Lymphs Abs 3.1 0.7 - 4.0 K/uL   Monocytes Relative 11 %   Monocytes Absolute 2.5 (H) 0.1 - 1.0 K/uL   Eosinophils Relative 0 %   Eosinophils Absolute 0.0 0.0 - 0.5 K/uL   Basophils Relative 0 %   Basophils Absolute 0.1 0.0 - 0.1 K/uL   Immature Granulocytes 1 %   Abs Immature Granulocytes 0.11 (H) 0.00 - 0.07 K/uL    Comment: Performed at [facility], [address] [place], [place] [address]  D-dimer, quantitative     Status: Abnormal   Collection Time: 04/24/22  9:45 PM  Result Value Ref Range   D-Dimer, Quant 3.74 (H) 0.00 - 0.50 ug/mL-FEU    Comment: (NOTE) At the manufacturer cut-off value of 0.5 g/mL FEU, this assay has a negative predictive value of 95-100%.This assay is intended for use in conjunction with a clinical pretest  probability (PTP) assessment model to exclude pulmonary embolism (PE) and deep venous thrombosis (DVT) in outpatients suspected of PE or DVT. Results should be correlated with clinical presentation. Performed at [facility], [address] [place], [place] [address]   Comprehensive metabolic panel     Status: Abnormal   Collection Time: 04/24/22  9:45 PM  Result Value Ref Range   Sodium 137 135 - 145 mmol/L   Potassium 4.7 3.5 - 5.1 mmol/L   Chloride 96 (L) 98 - 111 mmol/L   CO2 30 22 - 32 mmol/L   Glucose, Bld 116 (H) 70 - 99 mg/dL    Comment: Glucose reference range applies only to samples taken after fasting for at least 8 hours.   BUN 24 (H) 6 - 20 mg/dL   Creatinine, Ser 0.98 (H) 0.61 - 1.24 mg/dL   Calcium 8.4 (L) 8.9 - 10.3 mg/dL   Total Protein 6.5 6.5 - 8.1 g/dL   Albumin 3.4 (L) 3.5 - 5.0 g/dL   AST 119 (H) 15 - 41 U/L   ALT 91 (H) 0 - 44 U/L   Alkaline Phosphatase 104 38 - 126 U/L   Total Bilirubin 0.6 0.3 - 1.2 mg/dL   GFR, Estimated >14 >78 mL/min    Comment: (NOTE) Calculated using the CKD-EPI Creatinine Equation (2021)    Anion gap 11 5 - 15    Comment: Performed at [facility], [address], [place], [place] [address]  Lipase, blood     Status: None   Collection Time: 04/24/22  9:45 PM  Result Value Ref Range   Lipase 20 11 - 51 U/L    Comment: Performed at [facility], [address] [place], [place] [address]  Troponin I (High Sensitivity)     Status: Abnormal   Collection Time: 04/24/22 10:48 PM  Result Value Ref Range   Troponin I (High Sensitivity) 706 (HH) <18 ng/L    Comment: CRITICAL VALUE NOTED. VALUE IS CONSISTENT WITH PREVIOUSLY REPORTED/CALLED VALUE (NOTE) Elevated high sensitivity troponin I (hsTnI) values and significant  changes across serial measurements may suggest ACS but many other  chronic and acute conditions are known to elevate hsTnI results.  Refer to the "Links" section for chest pain algorithms and  additional  guidance. Performed at [facility], [address], [place], [place] [address]   Respiratory (~20 pathogens) panel by PCR     Status: None   Collection Time: 04/25/22  1:22 AM  Specimen: Nasopharyngeal Swab; Respiratory  Result Value Ref Range   Adenovirus NOT DETECTED NOT DETECTED   Coronavirus 229E NOT DETECTED NOT DETECTED    Comment: (NOTE) The Coronavirus on the Respiratory Panel, DOES NOT test for the novel  Coronavirus (2019 nCoV)    Coronavirus HKU1 NOT DETECTED NOT DETECTED   Coronavirus NL63 NOT DETECTED NOT DETECTED   Coronavirus OC43 NOT DETECTED NOT DETECTED   Metapneumovirus NOT DETECTED NOT DETECTED   Rhinovirus / Enterovirus NOT DETECTED NOT DETECTED   Influenza A NOT DETECTED NOT DETECTED   Influenza B NOT DETECTED NOT DETECTED   Parainfluenza Virus 1 NOT DETECTED NOT DETECTED   Parainfluenza Virus 2 NOT DETECTED NOT DETECTED   Parainfluenza Virus 3 NOT DETECTED NOT DETECTED   Parainfluenza Virus 4 NOT DETECTED NOT DETECTED   Respiratory Syncytial Virus NOT DETECTED NOT DETECTED   Bordetella pertussis NOT DETECTED NOT DETECTED   Bordetella Parapertussis NOT DETECTED NOT DETECTED   Chlamydophila pneumoniae NOT DETECTED NOT DETECTED   Mycoplasma pneumoniae NOT DETECTED NOT DETECTED    Comment: Performed at [facility], [address], [place], [place] [address]  Culture, blood (Routine X 2) w Reflex to ID Panel     Status: None (Preliminary result)   Collection Time: 04/25/22  3:25 AM   Specimen: BLOOD RIGHT FOREARM  Result Value Ref Range   Specimen Description BLOOD RIGHT FOREARM    Special Requests      BOTTLES DRAWN AEROBIC AND ANAEROBIC Blood Culture adequate volume   Culture      NO GROWTH < 12 HOURS Performed at [facility], [address], [place], [place] [address]    Report Status PENDING   Sedimentation rate     Status: None   Collection Time: 04/25/22  3:25 AM  Result Value Ref Range   Sed Rate 4 0 - 16 mm/hr     Comment: Performed at [facility], [address] [place], [place] [address]  C-reactive protein     Status: Abnormal   Collection Time: 04/25/22  3:25 AM  Result Value Ref Range   CRP 7.0 (H) <1.0 mg/dL    Comment: Performed at [facility], [address], [place], [place] [address]  HIV Antibody (routine testing w rflx)     Status: None   Collection Time: 04/25/22  3:25 AM  Result Value Ref Range   HIV Screen 4th Generation wRfx Non Reactive Non Reactive    Comment: Performed at [facility], [address] [place], [place] [address]  Procalcitonin     Status: None   Collection Time: 04/25/22  3:25 AM  Result Value Ref Range   Procalcitonin 0.21 ng/mL    Comment:        Interpretation: PCT (Procalcitonin) <= 0.5 ng/mL: Systemic infection (sepsis) is not likely. Local bacterial infection is possible. (NOTE)       Sepsis PCT Algorithm           Lower Respiratory Tract                                      Infection PCT Algorithm    ----------------------------     ----------------------------         PCT < 0.25 ng/mL                PCT < 0.10 ng/mL  Strongly encourage             Strongly discourage   discontinuation of antibiotics    initiation of antibiotics    ----------------------------     -----------------------------       PCT 0.25 - 0.50 ng/mL            PCT 0.10 - 0.25 ng/mL               OR       >80% decrease in PCT            Discourage initiation of                                            antibiotics      Encourage discontinuation           of antibiotics    ----------------------------     -----------------------------         PCT >= 0.50 ng/mL              PCT 0.26 - 0.50 ng/mL               AND        <80% decrease in PCT             Encourage initiation of                                             antibiotics       Encourage continuation           of antibiotics    ----------------------------      -----------------------------        PCT >= 0.50 ng/mL                  PCT > 0.50 ng/mL               AND         increase in PCT                  Strongly encourage                                      initiation of antibiotics    Strongly encourage escalation           of antibiotics                                     -----------------------------                                           PCT <= 0.25 ng/mL                                                 OR                                        >  80% decrease in PCT                                      Discontinue / Do not initiate                                             antibiotics  Performed at [facility], [address] [place], [place] [address]   Comprehensive metabolic panel     Status: Abnormal   Collection Time: 04/25/22  3:25 AM  Result Value Ref Range   Sodium 139 135 - 145 mmol/L   Potassium 4.5 3.5 - 5.1 mmol/L   Chloride 95 (L) 98 - 111 mmol/L   CO2 30 22 - 32 mmol/L   Glucose, Bld 135 (H) 70 - 99 mg/dL    Comment: Glucose reference range applies only to samples taken after fasting for at least 8 hours.   BUN 21 (H) 6 - 20 mg/dL   Creatinine, Ser 3.241.29 (H) 0.61 - 1.24 mg/dL   Calcium 8.8 (L) 8.9 - 10.3 mg/dL   Total Protein 6.7 6.5 - 8.1 g/dL   Albumin 3.4 (L) 3.5 - 5.0 g/dL   AST 401116 (H) 15 - 41 U/L   ALT 97 (H) 0 - 44 U/L   Alkaline Phosphatase 104 38 - 126 U/L   Total Bilirubin 0.6 0.3 - 1.2 mg/dL   GFR, Estimated >02>60 >72>60 mL/min    Comment: (NOTE) Calculated using the CKD-EPI Creatinine Equation (2021)    Anion gap 14 5 - 15    Comment: Performed at [facility], [address] [place], [place] [address]  CBC     Status: Abnormal   Collection Time: 04/25/22  3:25 AM  Result Value Ref Range   WBC 22.6 (H) 4.0 - 10.5 K/uL   RBC 5.30 4.22 - 5.81 MIL/uL   Hemoglobin 16.9 13.0 - 17.0 g/dL   HCT 40.349.9 47.439.0 - 25.952.0 %   MCV 94.2 80.0 - 100.0 fL   MCH 31.9 26.0 - 34.0 pg   MCHC 33.9 30.0  - 36.0 g/dL   RDW 56.313.2 87.511.5 - 64.315.5 %   Platelets 278 150 - 400 K/uL   nRBC 0.0 0.0 - 0.2 %    Comment: Performed at [facility], [address] [place], [place] [address]  Urinalysis, Routine w reflex microscopic Urine, Clean Catch     Status: Abnormal   Collection Time: 04/25/22  4:12 AM  Result Value Ref Range   Color, Urine YELLOW YELLOW   APPearance CLEAR CLEAR   Specific Gravity, Urine 1.040 (H) 1.005 - 1.030   pH 6.0 5.0 - 8.0   Glucose, UA NEGATIVE NEGATIVE mg/dL   Hgb urine dipstick NEGATIVE NEGATIVE   Bilirubin Urine NEGATIVE NEGATIVE   Ketones, ur 5 (A) NEGATIVE mg/dL   Protein, ur 30 (A) NEGATIVE mg/dL   Nitrite NEGATIVE NEGATIVE   Leukocytes,Ua NEGATIVE NEGATIVE   RBC / HPF 0-5 0 - 5 RBC/hpf   WBC, UA 0-5 0 - 5 WBC/hpf   Bacteria, UA NONE SEEN NONE SEEN   Squamous Epithelial / LPF 0-5 0 - 5   Mucus PRESENT    Hyaline Casts, UA PRESENT     Comment: Performed at [facility], [address] [place], [place] [address]  Rapid urine drug screen (hospital performed)     Status: None   Collection Time: 04/25/22  4:12 AM  Result Value Ref Range   Opiates NONE DETECTED NONE DETECTED   Cocaine NONE DETECTED NONE DETECTED   Benzodiazepines NONE DETECTED NONE DETECTED   Amphetamines NONE DETECTED NONE DETECTED   Tetrahydrocannabinol NONE DETECTED NONE DETECTED   Barbiturates NONE DETECTED NONE DETECTED    Comment: (NOTE) DRUG SCREEN FOR MEDICAL PURPOSES ONLY.  IF CONFIRMATION IS NEEDED FOR ANY PURPOSE, NOTIFY LAB WITHIN 5 DAYS.  LOWEST DETECTABLE LIMITS FOR URINE DRUG SCREEN Drug Class                     Cutoff (ng/mL) Amphetamine and metabolites    1000 Barbiturate and metabolites    200 Benzodiazepine                 200 Opiates and metabolites        300 Cocaine and metabolites        300 THC                            50 Performed at [facility], [address], [place], [place] [address]   Strep pneumoniae urinary antigen     Status:  None   Collection Time: 04/25/22  4:12 AM  Result Value Ref Range   Strep Pneumo Urinary Antigen NEGATIVE NEGATIVE    Comment:        Infection due to S. pneumoniae cannot be absolutely ruled out since the antigen present may be below the detection limit of the test. Performed at [facility], [address] [place], [place] [address]    ECHOCARDIOGRAM COMPLETE  Result Date: 04/25/2022    ECHOCARDIOGRAM REPORT   Patient Name:   Daniel [name] Date of Exam: 04/25/2022 Medical Rec #:  [id number]                  Height:       74.0 in Accession #:    [id number]                 Weight:       235.0 lb Date of Birth:  1995/07/29                 BSA:          2.328 m Patient Age:    26 years                   BP:           131/98 mmHg Patient Gender: M                          HR:           88 bpm. Exam Location:  Inpatient Procedure: 2D Echo Indications:    elevated troponin  History:        Patient has no prior history of Echocardiogram examinations.                 Risk Factors:Current Smoker.  Sonographer:    Delcie Roch RDCS Referring Phys: [id number] VISHAL R PATEL  Sonographer Comments: Image acquisition challenging due to patient body habitus. IMPRESSIONS  1. Left ventricular ejection fraction, by estimation, is 50 to 55%. The left ventricle has low  normal function. The left ventricle has no regional wall motion abnormalities. There is mild left ventricular hypertrophy. Left ventricular diastolic parameters were normal. There is the interventricular septum is flattened in systole, consistent with right ventricular pressure overload. Suggestive of RV strain.  2. Right ventricular systolic function was not well visualized. The right ventricular size is normal. Tricuspid regurgitation signal is inadequate for assessing PA pressure.  3. The mitral valve is normal in structure. No evidence of mitral valve regurgitation. No evidence of mitral stenosis.  4. The aortic valve is  tricuspid. Aortic valve regurgitation is not visualized. No aortic stenosis is present. Comparison(s): No prior Echocardiogram. FINDINGS  Left Ventricle: Left ventricular ejection fraction, by estimation, is 50 to 55%. The left ventricle has low normal function. The left ventricle has no regional wall motion abnormalities. The left ventricular internal cavity size was normal in size. There is mild left ventricular hypertrophy. The interventricular septum is flattened in systole, consistent with right ventricular pressure overload. Left ventricular diastolic parameters were normal. Right Ventricle: The right ventricular size is normal. No increase in right ventricular wall thickness. Right ventricular systolic function was not well visualized. Tricuspid regurgitation signal is inadequate for assessing PA pressure. Left Atrium: Left atrial size was normal in size. Right Atrium: Right atrial size was normal in size. Pericardium: There is no evidence of pericardial effusion. Presence of epicardial fat layer. Mitral Valve: The mitral valve is normal in structure. No evidence of mitral valve regurgitation. No evidence of mitral valve stenosis. Tricuspid Valve: The tricuspid valve is normal in structure. Tricuspid valve regurgitation is not demonstrated. No evidence of tricuspid stenosis. Aortic Valve: The aortic valve is tricuspid. Aortic valve regurgitation is not visualized. No aortic stenosis is present. Pulmonic Valve: The pulmonic valve was not well visualized. Pulmonic valve regurgitation is not visualized. No evidence of pulmonic stenosis. Aorta: The aortic root and ascending aorta are structurally normal, with no evidence of dilitation. IAS/Shunts: No atrial level shunt detected by color flow Doppler.  LEFT VENTRICLE PLAX 2D LVIDd:         4.20 cm     Diastology LVIDs:         3.00 cm     LV e' medial:  7.29 cm/s LV PW:         1.00 cm     LV e' lateral: 12.70 cm/s LV IVS:        1.10 cm LVOT diam:     2.10 cm LV  SV:         48 LV SV Index:   21 LVOT Area:     3.46 cm  LV Volumes (MOD) LV vol d, MOD A4C: 77.3 ml LV vol s, MOD A4C: 37.6 ml LV SV MOD A4C:     77.3 ml RIGHT VENTRICLE             IVC RV S prime:     13.30 cm/s  IVC diam: 1.60 cm TAPSE (M-mode): 1.9 cm LEFT ATRIUM             Index        RIGHT ATRIUM           Index LA diam:        2.90 cm 1.25 cm/m   RA Area:     13.10 cm LA Vol (A2C):   33.0 ml 14.18 ml/m  RA Volume:   32.40 ml  13.92 ml/m LA Vol (A4C):   26.3 ml 11.30 ml/m LA Biplane Vol:  30.4 ml 13.06 ml/m  AORTIC VALVE LVOT Vmax:   85.90 cm/s LVOT Vmean:  55.800 cm/s LVOT VTI:    0.139 m  AORTA Ao Root diam: 3.20 cm Ao Asc diam:  2.80 cm  SHUNTS Systemic VTI:  0.14 m Systemic Diam: 2.10 cm Riley Lam MD Electronically signed by Riley Lam MD Signature Date/Time: 04/25/2022/1:10:30 PM    Final    CT Angio Chest PE W and/or Wo Contrast  Result Date: 04/24/2022 CLINICAL DATA:  Pulmonary embolism (PE) suspected, low to intermediate prob, positive D-dimer Cough. EXAM: CT ANGIOGRAPHY CHEST WITH CONTRAST TECHNIQUE: Multidetector CT imaging of the chest was performed using the standard protocol during bolus administration of intravenous contrast. Multiplanar CT image reconstructions and MIPs were obtained to evaluate the vascular anatomy. RADIATION DOSE REDUCTION: This exam was performed according to the departmental dose-optimization program which includes automated exposure control, adjustment of the mA and/or kV according to patient size and/or use of iterative reconstruction technique. CONTRAST:  47mL OMNIPAQUE IOHEXOL 350 MG/ML SOLN COMPARISON:  Radiograph earlier today. FINDINGS: Cardiovascular: There are no filling defects within the pulmonary arteries to suggest pulmonary embolus. The heart is upper normal in size. Normal thoracic aorta without dissection. No pericardial effusion. Mediastinum/Nodes: Small scattered mediastinal and hilar lymph nodes, typically reactive. No  thyroid nodule. Patulous distal esophagus. Lungs/Pleura: Extensive bilateral lung opacities, primarily ground-glass and nodular. There is a upper lobe predominant and right greater than left lung predominance. No significant pleural effusion. Upper Abdomen: No acute or unexpected findings. Musculoskeletal: There are no acute or suspicious osseous abnormalities. No chest wall soft tissue abnormalities. Review of the MIP images confirms the above findings. IMPRESSION: 1. No pulmonary embolus. 2. Extensive bilateral lung opacities, primarily ground-glass and nodular. Differential considerations include multifocal pneumonia or pulmonary edema. 3. Upper normal heart size. Electronically Signed   By: Narda Rutherford M.D.   On: 04/24/2022 23:42   DG Chest Portable 1 View  Result Date: 04/24/2022 CLINICAL DATA:  Hypoxia. EXAM: PORTABLE CHEST 1 VIEW COMPARISON:  05/12/2019. FINDINGS: The heart size and mediastinal contours are within normal limits. Hazy diffuse airspace disease is present in the lungs, greater on the right than on the left. No effusion or pneumothorax. No acute osseous abnormality. IMPRESSION: Diffuse hazy airspace disease in the lungs bilaterally, greater on the right than on the left, possible edema or pneumonia. Electronically Signed   By: Thornell Sartorius M.D.   On: 04/24/2022 20:27    Pending Labs Unresulted Labs (From admission, onward)     Start     Ordered   04/26/22 0500  CBC  Daily at 5am,   R      04/25/22 0922   04/26/22 0500  Comprehensive metabolic panel  Daily at 5am,   R      04/25/22 0922   04/26/22 0500  Hepatitis panel, acute  Tomorrow morning,   R        04/25/22 0922   04/26/22 0500  Magnesium  Tomorrow morning,   R        04/25/22 0922   04/25/22 0124  Legionella Pneumophila Serogp 1 Ur Ag  Once,   R        04/25/22 0123   04/25/22 0049  Culture, blood (Routine X 2) w Reflex to ID Panel  BLOOD CULTURE X 2,   R      04/25/22 0048   04/24/22 2008  CBC with  Differential  Once,   STAT  04/24/22 2007            Vitals/Pain Today's Vitals   04/25/22 1333 04/25/22 1355 04/25/22 1400 04/25/22 1430  BP: 110/80  124/75 116/79  Pulse: 89  84 83  Resp: (!) 31  (!) 27 (!) 25  Temp:  99 F (37.2 C)    TempSrc:  Oral    SpO2: 94%  95% 96%  PainSc:        Isolation Precautions Droplet precaution  Medications Medications  azithromycin (ZITHROMAX) 500 mg in sodium chloride 0.9 % 250 mL IVPB (0 mg Intravenous Stopped 04/25/22 1004)  cefTRIAXone (ROCEPHIN) 2 g in sodium chloride 0.9 % 100 mL IVPB (0 g Intravenous Stopped 04/25/22 0937)  enoxaparin (LOVENOX) injection 40 mg (40 mg Subcutaneous Given 04/25/22 0857)  sodium chloride flush (NS) 0.9 % injection 3 mL (3 mLs Intravenous Given 04/25/22 0901)  acetaminophen (TYLENOL) tablet 650 mg (has no administration in time range)    Or  acetaminophen (TYLENOL) suppository 650 mg (has no administration in time range)  ondansetron (ZOFRAN) tablet 4 mg (has no administration in time range)    Or  ondansetron (ZOFRAN) injection 4 mg (has no administration in time range)  senna-docusate (Senokot-S) tablet 1 tablet (has no administration in time range)  ondansetron (ZOFRAN) injection 4 mg (4 mg Intravenous Given 04/24/22 2042)  lactated ringers bolus 1,000 mL (0 mLs Intravenous Stopped 04/24/22 2158)  aspirin chewable tablet 324 mg (324 mg Oral Given 04/24/22 2205)  iohexol (OMNIPAQUE) 350 MG/ML injection 75 mL (75 mLs Intravenous Contrast Given 04/24/22 2325)  cefTRIAXone (ROCEPHIN) 1 g in sodium chloride 0.9 % 100 mL IVPB (0 g Intravenous Stopped 04/25/22 0049)  azithromycin (ZITHROMAX) 500 mg in sodium chloride 0.9 % 250 mL IVPB (0 mg Intravenous Stopped 04/25/22 0120)    Mobility walks Low fall risk   Focused Assessments Pulmonary Assessment Handoff:  Lung sounds: Bilateral Breath Sounds: Coarse crackles O2 Device: Nasal Cannula O2 Flow Rate (L/min): 3  L/min    R Recommendations: See Admitting Provider Note  Report given to:   Additional Notes:

## 2022-04-25 NOTE — Assessment & Plan Note (Signed)
Initial troponin 971 >> 706.  EKG shows sinus tach with nonspecific T wave changes.  He has not had any chest pain.  BNP elevated at 419.1.  He has been given aspirin in the ED. -Keep on telemetry -Obtain echocardiogram -Hold further fluids, consider diuresis pending progress

## 2022-04-25 NOTE — H&P (Signed)
History and Physical    Daniel [name] TJQ:[id number] DOB: 1995/11/27 DOA: 04/24/2022  PCP: Maury Dus, MD  Patient coming from: Home  I have personally briefly reviewed patient's old medical records in [place]  Chief Complaint: Cough, dyspnea  HPI: Daniel [name] is a 26 y.o. male with medical history significant for ADHD who presented to the ED for evaluation of cough.  Patient reports several days of cough productive of yellow sputum, chest congestion, dyspnea at night with orthopnea.  He has had some nausea and vomiting as well.  He thought this was related to the cold weather change.  He denies any subjective fevers, chills, diaphoresis, chest pain, peripheral edema.  Reports smoking about 3 black and milds per day.  He denies any illicit drug use.  He does not have any pets.  He is not currently working.  ED Course  Labs/Imaging on admission: I have personally reviewed following labs and imaging studies.  Initial vitals showed BP 130/93, pulse 121, RR 22, temp 99.4 F, SpO2 77% on room air.  Patient placed on 3 L O2 via [place] with SpO2 94%.  Labs show WBC 22.9, hemoglobin 17.1, platelets 281,000, sodium 137, potassium 4.7, bicarb 30, BUN 24, creatinine 1.50, serum glucose 116, AST 124, ALT 91, alk phos 104, total bilirubin 0.6, D-dimer 3.74, lipase 20, troponin 971 > 706, BNP 419.1.  SARS-CoV-2 and influenza PCR negative.  CTA chest negative for PE.  Extensive bilateral lung opacities, primarily groundglass and nodular seen.  Patient was given 1 L LR, aspirin 324 mg, IV ceftriaxone and azithromycin.  The hospitalist service was consulted to admit for further evaluation and management.  Review of Systems: All systems reviewed and are negative except as documented in history of present illness above.   Past Medical History:  Diagnosis Date   ADHD (attention deficit hyperactivity disorder)     No past surgical history on file.  Social  History:  reports that he has been smoking. He has never used smokeless tobacco. He reports current drug use. He reports that he does not drink alcohol.  No Known Allergies  No family history on file.   Prior to Admission medications   Not on File    Physical Exam: Vitals:   04/24/22 2300 04/25/22 0000 04/25/22 0015 04/25/22 0015  BP: (!) 158/100 (!) 155/142    Pulse: 100 97 100   Resp: (!) 28 (!) 29 17   Temp:    98.9 F (37.2 C)  SpO2: 96% 99% 96% 96%   Constitutional: Resting in bed, NAD, calm, comfortable Eyes: EOMI, lids and conjunctivae normal ENMT: Mucous membranes are moist. Posterior pharynx clear of any exudate or lesions.Normal dentition.  Neck: normal, supple, no masses. Respiratory: Expiratory crackles throughout. Normal respiratory effort. No accessory muscle use.  Cardiovascular: Regular rate and rhythm, no murmurs / rubs / gallops. No extremity edema. 2+ pedal pulses. Abdomen: no tenderness, no masses palpated.  Musculoskeletal: no clubbing / cyanosis. No joint deformity upper and lower extremities. Good ROM, no contractures. Normal muscle tone.  Skin: no rashes, lesions, ulcers. No induration Neurologic: Sensation intact. Strength 5/5 in all 4.  Psychiatric: Alert and oriented x 3. Normal mood.   EKG: Personally reviewed. Sinus tachycardia, rate 115.  Nonspecific T wave changes inferior lateral leads.  Tachycardia and T wave changes new when compared to previous from 2021.  Assessment/Plan Principal Problem:   Sepsis due to pneumonia [facility]) Active Problems:   Elevated troponin   Acute respiratory  failure with hypoxia (HCC)   AKI (acute kidney injury) ([place])   Daniel [name] is a 26 y.o. male with medical history significant for ADHD who is admitted with sepsis due to multifocal pneumonia.  Assessment and Plan: * Sepsis due to pneumonia [facility]) Presenting with leukocytosis, tachycardia, and extensive infiltrates throughout bilateral lung fields  on CT imaging concerning for multifocal pneumonia.  COVID and influenza negative. -Continue empiric IV ceftriaxone and azithromycin -Obtain blood cultures -Obtain full RVP panel, strep pneumonia urinary antigen, ESR, CRP  Elevated troponin Initial troponin 971 >> 706.  EKG shows sinus tach with nonspecific T wave changes.  He has not had any chest pain.  BNP elevated at 419.1.  He has been given aspirin in the ED. -Keep on telemetry -Obtain echocardiogram -Hold further fluids, consider diuresis pending progress  Acute respiratory failure with hypoxia (HCC) SpO2 77% on room air on arrival, stable on 3 L O2 via [place] on admission.  Secondary to extensive multifocal pneumonia. -Continue supplemental O2 and wean as able  AKI (acute kidney injury) ([place]) In setting of sepsis.  Creatinine 1.50 on admission compared to previous 1.04 in 2021. -S/p 1 L LR in the ED -Hold further fluids and repeat labs in a.m.  DVT prophylaxis: enoxaparin (LOVENOX) injection 40 mg Start: 04/25/22 0800 Code Status: Full code Family Communication: Discussed with significant other by phone Disposition Plan: From home and likely discharge to home pending clinical progress Consults called: None Severity of Illness: The appropriate patient status for this patient is INPATIENT. Inpatient status is judged to be reasonable and necessary in order to provide the required intensity of service to ensure the patient's safety. The patient's presenting symptoms, physical exam findings, and initial radiographic and laboratory data in the context of their chronic comorbidities is felt to place them at high risk for further clinical deterioration. Furthermore, it is not anticipated that the patient will be medically stable for discharge from the hospital within 2 midnights of admission.   * I certify that at the point of admission it is my clinical judgment that the patient will require inpatient hospital care spanning beyond 2 midnights  from the point of admission due to high intensity of service, high risk for further deterioration and high frequency of surveillance required.Zada Finders MD [facility]  If 7PM-7AM, please contact night-coverage [email or web address]  04/25/2022, 1:31 AM

## 2022-04-25 NOTE — Hospital Course (Signed)
Daniel [name] is a 26 y.o. male with medical history significant for ADHD who is admitted with sepsis due to multifocal pneumonia.

## 2022-04-25 NOTE — Assessment & Plan Note (Signed)
SpO2 77% on room air on arrival, stable on 3 L O2 via  on admission.  Secondary to extensive multifocal pneumonia. -Continue supplemental O2 and wean as able

## 2022-04-26 ENCOUNTER — Inpatient Hospital Stay (HOSPITAL_COMMUNITY): Payer: 59

## 2022-04-26 DIAGNOSIS — R7989 Other specified abnormal findings of blood chemistry: Secondary | ICD-10-CM

## 2022-04-26 DIAGNOSIS — A419 Sepsis, unspecified organism: Secondary | ICD-10-CM | POA: Diagnosis not present

## 2022-04-26 DIAGNOSIS — J189 Pneumonia, unspecified organism: Secondary | ICD-10-CM | POA: Diagnosis not present

## 2022-04-26 LAB — HEPATITIS PANEL, ACUTE
HCV Ab: NONREACTIVE
Hep A IgM: NONREACTIVE
Hep B C IgM: NONREACTIVE
Hepatitis B Surface Ag: NONREACTIVE

## 2022-04-26 LAB — COMPREHENSIVE METABOLIC PANEL
ALT: 107 U/L — ABNORMAL HIGH (ref 0–44)
AST: 78 U/L — ABNORMAL HIGH (ref 15–41)
Albumin: 3.1 g/dL — ABNORMAL LOW (ref 3.5–5.0)
Alkaline Phosphatase: 85 U/L (ref 38–126)
Anion gap: 11 (ref 5–15)
BUN: 16 mg/dL (ref 6–20)
CO2: 27 mmol/L (ref 22–32)
Calcium: 8.7 mg/dL — ABNORMAL LOW (ref 8.9–10.3)
Chloride: 98 mmol/L (ref 98–111)
Creatinine, Ser: 0.87 mg/dL (ref 0.61–1.24)
GFR, Estimated: >60 mL/min (ref >60)
Glucose, Bld: 104 mg/dL — ABNORMAL HIGH (ref 70–99)
Potassium: 4 mmol/L (ref 3.5–5.1)
Sodium: 136 mmol/L (ref 135–145)
Total Bilirubin: 0.4 mg/dL (ref 0.3–1.2)
Total Protein: 6.4 g/dL — ABNORMAL LOW (ref 6.5–8.1)

## 2022-04-26 LAB — CBC
HCT: 46.6 % (ref 39.0–52.0)
Hemoglobin: 15.5 g/dL (ref 13.0–17.0)
MCH: 31.4 pg (ref 26.0–34.0)
MCHC: 33.3 g/dL (ref 30.0–36.0)
MCV: 94.3 fL (ref 80.0–100.0)
Platelets: 251 10*3/uL (ref 150–400)
RBC: 4.94 MIL/uL (ref 4.22–5.81)
RDW: 12.9 % (ref 11.5–15.5)
WBC: 15.1 10*3/uL — ABNORMAL HIGH (ref 4.0–10.5)
nRBC: 0 % (ref 0.0–0.2)

## 2022-04-26 LAB — MAGNESIUM: Magnesium: 1.9 mg/dL (ref 1.7–2.4)

## 2022-04-26 NOTE — Progress Notes (Signed)
[facility] PROGRESS NOTE   Daniel [name] LKJ:[id number] DOB: 09-13-1995 DOA: 04/24/2022  PCP: Maury Dus, MD  Brief History/Interval Summary: 26 y.o. male with medical history significant for ADHD who presented to the ED for evaluation of cough.  Patient was found to have multifocal pneumonia.  He was noted to be hypoxic with saturations in the mid 70s on room air.  He was hospitalized for further management.  Consultants: None  Procedures: None    Subjective/Interval History: Patient very somnolent this morning.  Mentions that his cough and congestion is better.  Shortness of breath is better as well.  Denies any chest pain.     Assessment/Plan:  Community-acquired pneumonia/sepsis present on admission Patient met sepsis criteria at admission with leukocytosis tachycardia findings of multifocal pneumonia on imaging studies. COVID-19 and influenza PCR negative.  Respiratory viral panel is negative.  Strep pneumoniae urinary antigen is negative. Blood cultures are negative so far. Continue with ceftriaxone and azithromycin. WBC noted to be better today.  Recheck labs tomorrow.  Acute respiratory failure with hypoxia Secondary to pneumonia.   Oxygenation appears to be improving.  Discussed with nursing staff.  Start weaning down oxygen today.  He was dropped down to 2 L/min.  Incentive spirometry.   CT angiogram was negative for PE.    Elevated troponin Patient denies any chest pain.  EKG shows nonspecific changes.  BNP was noted to be mildly elevated.  Echocardiogram shows low normal left ventricular EF without any regional wall motion abnormalities.  Findings did suggest right ventricular pressure overload and RV strain.   As mentioned above CT scan did not show any pulmonary embolism. His elevated troponins could be due to demand ischemia.  He will benefit from cardiology evaluation but this can be pursued in the outpatient setting.  Will refer him to  cardiology at the time of discharge.   Check lipid panel.  Blood pressure is reasonably well-controlled.  Acute kidney injury Likely prerenal.  Was given IV fluids in the emergency department.  Renal function is now back to baseline.  Transaminitis Mildly elevated AST and ALT level noted.  Possibly related to sepsis.   Abdomen is benign.  No hepatomegaly appreciated.  Hepatitis panel is pending.  LFTs are stable.  Will proceed with right upper quadrant ultrasound.  Patient denies any alcohol intake.  Obesity Estimated body mass index is 30.17 kg/m as calculated from the following:   Height as of 10/06/19: _0  (1.88 m).   Weight as of 10/06/19: 106.6 kg.  DVT Prophylaxis: Lovenox Code Status: Full code Family Communication: Discussed with patient Disposition Plan: Hopefully return home when improved.  Start mobilizing.  Status is: Inpatient Remains inpatient appropriate because: Acute respiratory failure with hypoxia      Medications: Scheduled:  enoxaparin (LOVENOX) injection  40 mg Subcutaneous Q24H   sodium chloride flush  3 mL Intravenous Q12H   Continuous:  azithromycin Stopped (04/25/22 1004)   cefTRIAXone (ROCEPHIN)  IV 2 g (04/26/22 1021)   VXY:[id number] **OR** acetaminophen, ondansetron **OR** ondansetron (ZOFRAN) IV, senna-docusate  Antibiotics: Anti-infectives (From admission, onward)    Start     Dose/Rate Route Frequency Ordered Stop   04/25/22 1000  azithromycin (ZITHROMAX) 500 mg in sodium chloride 0.9 % 250 mL IVPB        500 mg 250 mL/hr over 60 Minutes Intravenous Every 24 hours 04/25/22 0123     04/25/22 1000  cefTRIAXone (ROCEPHIN) 2 g in sodium chloride 0.9 % 100 mL IVPB  2 g 200 mL/hr over 30 Minutes Intravenous Every 24 hours 04/25/22 0123     04/25/22 0000  cefTRIAXone (ROCEPHIN) 1 g in sodium chloride 0.9 % 100 mL IVPB        1 g 200 mL/hr over 30 Minutes Intravenous  Once 04/24/22 2346 04/25/22 0049   04/25/22 0000  azithromycin  (ZITHROMAX) 500 mg in sodium chloride 0.9 % 250 mL IVPB        500 mg 250 mL/hr over 60 Minutes Intravenous  Once 04/24/22 2346 04/25/22 0120       Objective:  Vital Signs  Vitals:   04/25/22 1653 04/25/22 2002 04/25/22 2300 04/26/22 0300  BP: (!) 129/91 (!) 132/97 (!) 108/97 112/67  Pulse: 79 87 80 64  Resp: 20 18 (!) 21 18  Temp: 98 F (36.7 C) 99.1 F (37.3 C) (!) 97.4 F (36.3 C) 98.3 F (36.8 C)  TempSrc: Oral Oral Oral Axillary  SpO2:  93% 93% 93%    Intake/Output Summary (Last 24 hours) at 04/26/2022 1024 Last data filed at 04/25/2022 1356 Gross per 24 hour  Intake --  Output 650 ml  Net -650 ml   There were no vitals filed for this visit.  General appearance: Awake alert.  In no distress Resp: Improved effort.  Fewer crackles today compared to yesterday.  No wheezing or rhonchi. Cardio: S1-S2 is normal regular.  No S3-S4.  No rubs murmurs or bruit GI: Abdomen is soft.  Nontender nondistended.  Bowel sounds are present normal.  No masses organomegaly Extremities: No edema.  Full range of motion of lower extremities. Neurologic: Alert and oriented x3.  No focal neurological deficits.    Lab Results:  Data Reviewed: I have personally reviewed following labs and reports of the imaging studies  CBC: Recent Labs  Lab 04/24/22 2145 04/25/22 0325 04/26/22 0247  WBC 22.9* 22.6* 15.1*  NEUTROABS 17.2*  --   --   HGB 17.1* 16.9 15.5  HCT 51.3 49.9 46.6  MCV 94.1 94.2 94.3  PLT 281 278 251     Basic Metabolic Panel: Recent Labs  Lab 04/24/22 2145 04/25/22 0325 04/26/22 0247  NA 137 139 136  K 4.7 4.5 4.0  CL 96* 95* 98  CO2 _0 GLUCOSE 116* 135* 104*  BUN 24* 21* 16  CREATININE 1.50* 1.29* 0.87  CALCIUM 8.4* 8.8* 8.7*  MG  --   --  1.9     GFR: CrCl cannot be calculated (Unknown ideal weight.).  Liver Function Tests: Recent Labs  Lab 04/24/22 2145 04/25/22 0325 04/26/22 0247  AST 124* 116* 78*  ALT 91* 97* 107*  ALKPHOS 104  104 85  BILITOT 0.6 0.6 0.4  PROT 6.5 6.7 6.4*  ALBUMIN 3.4* 3.4* 3.1*     Recent Labs  Lab 04/24/22 2145  LIPASE 20      Recent Results (from the past 240 hour(s))  Resp Panel by RT-PCR (Flu A&B, Covid) Anterior Nasal Swab     Status: None   Collection Time: 04/24/22  8:25 PM   Specimen: Anterior Nasal Swab  Result Value Ref Range Status   SARS Coronavirus 2 by RT PCR NEGATIVE NEGATIVE Final    Comment: (NOTE) SARS-CoV-2 target nucleic acids are NOT DETECTED.  The SARS-CoV-2 RNA is generally detectable in upper respiratory specimens during the acute phase of infection. The lowest concentration of SARS-CoV-2 viral copies this assay can detect is 138 copies/mL. A negative result does not preclude SARS-Cov-2 infection and should  not be used as the sole basis for treatment or other patient management decisions. A negative result may occur with  improper specimen collection/handling, submission of specimen other than nasopharyngeal swab, presence of viral mutation(s) within the areas targeted by this assay, and inadequate number of viral copies(<138 copies/mL). A negative result must be combined with clinical observations, patient history, and epidemiological information. The expected result is Negative.  Fact Sheet for Patients:  EntrepreneurPulse.com.au  Fact Sheet for Healthcare Providers:  IncredibleEmployment.be  This test is no t yet approved or cleared by the [place] FDA and  has been authorized for detection and/or diagnosis of SARS-CoV-2 by FDA under an Emergency Use Authorization (EUA). This EUA will remain  in effect (meaning this test can be used) for the duration of the COVID-19 declaration under Section 564(b)(1) of the Act, 21 U.S.C.section 360bbb-3(b)(1), unless the authorization is terminated  or revoked sooner.       Influenza A by PCR NEGATIVE NEGATIVE Final   Influenza B by PCR NEGATIVE NEGATIVE Final     Comment: (NOTE) The Xpert Xpress SARS-CoV-2/FLU/RSV plus assay is intended as an aid in the diagnosis of influenza from Nasopharyngeal swab specimens and should not be used as a sole basis for treatment. Nasal washings and aspirates are unacceptable for Xpert Xpress SARS-CoV-2/FLU/RSV testing.  Fact Sheet for Patients: EntrepreneurPulse.com.au  Fact Sheet for Healthcare Providers: IncredibleEmployment.be  This test is not yet approved or cleared by the [place] FDA and has been authorized for detection and/or diagnosis of SARS-CoV-2 by FDA under an Emergency Use Authorization (EUA). This EUA will remain in effect (meaning this test can be used) for the duration of the COVID-19 declaration under Section 564(b)(1) of the Act, 21 U.S.C. section 360bbb-3(b)(1), unless the authorization is terminated or revoked.  Performed at [facility], [place] [address] [place], [place] [address]   Respiratory (~20 pathogens) panel by PCR     Status: None   Collection Time: 04/25/22  1:22 AM   Specimen: Nasopharyngeal Swab; Respiratory  Result Value Ref Range Status   Adenovirus NOT DETECTED NOT DETECTED Final   Coronavirus 229E NOT DETECTED NOT DETECTED Final    Comment: (NOTE) The Coronavirus on the Respiratory Panel, DOES NOT test for the novel  Coronavirus (2019 nCoV)    Coronavirus HKU1 NOT DETECTED NOT DETECTED Final   Coronavirus NL63 NOT DETECTED NOT DETECTED Final   Coronavirus OC43 NOT DETECTED NOT DETECTED Final   Metapneumovirus NOT DETECTED NOT DETECTED Final   Rhinovirus / Enterovirus NOT DETECTED NOT DETECTED Final   Influenza A NOT DETECTED NOT DETECTED Final   Influenza B NOT DETECTED NOT DETECTED Final   Parainfluenza Virus 1 NOT DETECTED NOT DETECTED Final   Parainfluenza Virus 2 NOT DETECTED NOT DETECTED Final   Parainfluenza Virus 3 NOT DETECTED NOT DETECTED Final   Parainfluenza Virus 4 NOT DETECTED NOT DETECTED Final    Respiratory Syncytial Virus NOT DETECTED NOT DETECTED Final   Bordetella pertussis NOT DETECTED NOT DETECTED Final   Bordetella Parapertussis NOT DETECTED NOT DETECTED Final   Chlamydophila pneumoniae NOT DETECTED NOT DETECTED Final   Mycoplasma pneumoniae NOT DETECTED NOT DETECTED Final    Comment: Performed at [facility], [place]. [address] [place], [place] [address]  Culture, blood (Routine X 2) w Reflex to ID Panel     Status: None (Preliminary result)   Collection Time: 04/25/22  3:25 AM   Specimen: BLOOD RIGHT FOREARM  Result Value Ref Range Status  Specimen Description BLOOD RIGHT FOREARM  Final   Special Requests   Final    BOTTLES DRAWN AEROBIC AND ANAEROBIC Blood Culture adequate volume   Culture   Final    NO GROWTH 1 DAY Performed at [facility], [address], [place], [place] [address]    Report Status PENDING  Incomplete  Culture, blood (Routine X 2) w Reflex to ID Panel     Status: None (Preliminary result)   Collection Time: 04/25/22  5:34 PM   Specimen: BLOOD LEFT ARM  Result Value Ref Range Status   Specimen Description BLOOD LEFT ARM  Final   Special Requests   Final    BOTTLES DRAWN AEROBIC AND ANAEROBIC Blood Culture adequate volume   Culture   Final    NO GROWTH < 24 HOURS Performed at [facility], [place] [address] [place], [place] [address]    Report Status PENDING  Incomplete      Radiology Studies: ECHOCARDIOGRAM COMPLETE  Result Date: 04/25/2022    ECHOCARDIOGRAM REPORT   Patient Name:   Daniel [name] Date of Exam: 04/25/2022 Medical Rec #:  [id number]                  Height:       74.0 in Accession #:    [id number]                 Weight:       235.0 lb Date of Birth:  11/03/1995                 BSA:          2.328 m Patient Age:    26 years                   BP:           131/98 mmHg Patient Gender: M                          HR:           88 bpm. Exam Location:  Inpatient Procedure: 2D Echo Indications:    elevated  troponin  History:        Patient has no prior history of Echocardiogram examinations.                 Risk Factors:Current Smoker.  Sonographer:    Johny Chess RDCS Referring Phys: [id number] VISHAL R PATEL  Sonographer Comments: Image acquisition challenging due to patient body habitus. IMPRESSIONS  1. Left ventricular ejection fraction, by estimation, is 50 to 55%. The left ventricle has low normal function. The left ventricle has no regional wall motion abnormalities. There is mild left ventricular hypertrophy. Left ventricular diastolic parameters were normal. There is the interventricular septum is flattened in systole, consistent with right ventricular pressure overload. Suggestive of RV strain.  2. Right ventricular systolic function was not well visualized. The right ventricular size is normal. Tricuspid regurgitation signal is inadequate for assessing PA pressure.  3. The mitral valve is normal in structure. No evidence of mitral valve regurgitation. No evidence of mitral stenosis.  4. The aortic valve is tricuspid. Aortic valve regurgitation is not visualized. No aortic stenosis is present. Comparison(s): No prior Echocardiogram. FINDINGS  Left Ventricle: Left ventricular ejection fraction, by estimation, is 50 to 55%. The left ventricle has low normal function. The left ventricle has no regional wall motion abnormalities. The  left ventricular internal cavity size was normal in size. There is mild left ventricular hypertrophy. The interventricular septum is flattened in systole, consistent with right ventricular pressure overload. Left ventricular diastolic parameters were normal. Right Ventricle: The right ventricular size is normal. No increase in right ventricular wall thickness. Right ventricular systolic function was not well visualized. Tricuspid regurgitation signal is inadequate for assessing PA pressure. Left Atrium: Left atrial size was normal in size. Right Atrium: Right atrial size was  normal in size. Pericardium: There is no evidence of pericardial effusion. Presence of epicardial fat layer. Mitral Valve: The mitral valve is normal in structure. No evidence of mitral valve regurgitation. No evidence of mitral valve stenosis. Tricuspid Valve: The tricuspid valve is normal in structure. Tricuspid valve regurgitation is not demonstrated. No evidence of tricuspid stenosis. Aortic Valve: The aortic valve is tricuspid. Aortic valve regurgitation is not visualized. No aortic stenosis is present. Pulmonic Valve: The pulmonic valve was not well visualized. Pulmonic valve regurgitation is not visualized. No evidence of pulmonic stenosis. Aorta: The aortic root and ascending aorta are structurally normal, with no evidence of dilitation. IAS/Shunts: No atrial level shunt detected by color flow Doppler.  LEFT VENTRICLE PLAX 2D LVIDd:         4.20 cm     Diastology LVIDs:         3.00 cm     LV e' medial:  7.29 cm/s LV PW:         1.00 cm     LV e' lateral: 12.70 cm/s LV IVS:        1.10 cm LVOT diam:     2.10 cm LV SV:         48 LV SV Index:   21 LVOT Area:     3.46 cm  LV Volumes (MOD) LV vol d, MOD A4C: 77.3 ml LV vol s, MOD A4C: 37.6 ml LV SV MOD A4C:     77.3 ml RIGHT VENTRICLE             IVC RV S prime:     13.30 cm/s  IVC diam: 1.60 cm TAPSE (M-mode): 1.9 cm LEFT ATRIUM             Index        RIGHT ATRIUM           Index LA diam:        2.90 cm 1.25 cm/m   RA Area:     13.10 cm LA Vol (A2C):   33.0 ml 14.18 ml/m  RA Volume:   32.40 ml  13.92 ml/m LA Vol (A4C):   26.3 ml 11.30 ml/m LA Biplane Vol: 30.4 ml 13.06 ml/m  AORTIC VALVE LVOT Vmax:   85.90 cm/s LVOT Vmean:  55.800 cm/s LVOT VTI:    0.139 m  AORTA Ao Root diam: 3.20 cm Ao Asc diam:  2.80 cm  SHUNTS Systemic VTI:  0.14 m Systemic Diam: 2.10 cm Rudean Haskell MD Electronically signed by Rudean Haskell MD Signature Date/Time: 04/25/2022/1:10:30 PM    Final    CT Angio Chest PE W and/or Wo Contrast  Result Date:  04/24/2022 CLINICAL DATA:  Pulmonary embolism (PE) suspected, low to intermediate prob, positive D-dimer Cough. EXAM: CT ANGIOGRAPHY CHEST WITH CONTRAST TECHNIQUE: Multidetector CT imaging of the chest was performed using the standard protocol during bolus administration of intravenous contrast. Multiplanar CT image reconstructions and MIPs were obtained to evaluate the vascular anatomy. RADIATION DOSE REDUCTION: This exam was performed  according to the departmental dose-optimization program which includes automated exposure control, adjustment of the mA and/or kV according to patient size and/or use of iterative reconstruction technique. CONTRAST:  94m OMNIPAQUE IOHEXOL 350 MG/ML SOLN COMPARISON:  Radiograph earlier today. FINDINGS: Cardiovascular: There are no filling defects within the pulmonary arteries to suggest pulmonary embolus. The heart is upper normal in size. Normal thoracic aorta without dissection. No pericardial effusion. Mediastinum/Nodes: Small scattered mediastinal and hilar lymph nodes, typically reactive. No thyroid nodule. Patulous distal esophagus. Lungs/Pleura: Extensive bilateral lung opacities, primarily ground-glass and nodular. There is a upper lobe predominant and right greater than left lung predominance. No significant pleural effusion. Upper Abdomen: No acute or unexpected findings. Musculoskeletal: There are no acute or suspicious osseous abnormalities. No chest wall soft tissue abnormalities. Review of the MIP images confirms the above findings. IMPRESSION: 1. No pulmonary embolus. 2. Extensive bilateral lung opacities, primarily ground-glass and nodular. Differential considerations include multifocal pneumonia or pulmonary edema. 3. Upper normal heart size. Electronically Signed   By: MKeith RakeM.D.   On: 04/24/2022 23:42   DG Chest Portable 1 View  Result Date: 04/24/2022 CLINICAL DATA:  Hypoxia. EXAM: PORTABLE CHEST 1 VIEW COMPARISON:  05/12/2019. FINDINGS: The heart  size and mediastinal contours are within normal limits. Hazy diffuse airspace disease is present in the lungs, greater on the right than on the left. No effusion or pneumothorax. No acute osseous abnormality. IMPRESSION: Diffuse hazy airspace disease in the lungs bilaterally, greater on the right than on the left, possible edema or pneumonia. Electronically Signed   By: LBrett FairyM.D.   On: 04/24/2022 20:27       LOS: 1 day   [facility] Pager on [email or web address]  04/26/2022, 10:24 AM

## 2022-04-26 NOTE — Progress Notes (Signed)
The patients O2 sats decrease to 83% while resting in bed with no O2 and desats to 70s while ambulating, O2 reapplied at 2L [place].  Incentive spirometry given and education provided

## 2022-04-27 ENCOUNTER — Inpatient Hospital Stay (HOSPITAL_COMMUNITY): Payer: 59

## 2022-04-27 DIAGNOSIS — A419 Sepsis, unspecified organism: Secondary | ICD-10-CM | POA: Diagnosis not present

## 2022-04-27 DIAGNOSIS — R7989 Other specified abnormal findings of blood chemistry: Secondary | ICD-10-CM | POA: Diagnosis not present

## 2022-04-27 DIAGNOSIS — J189 Pneumonia, unspecified organism: Secondary | ICD-10-CM | POA: Diagnosis not present

## 2022-04-27 LAB — COMPREHENSIVE METABOLIC PANEL
ALT: 99 U/L — ABNORMAL HIGH (ref 0–44)
AST: 48 U/L — ABNORMAL HIGH (ref 15–41)
Albumin: 2.9 g/dL — ABNORMAL LOW (ref 3.5–5.0)
Alkaline Phosphatase: 74 U/L (ref 38–126)
Anion gap: 11 (ref 5–15)
BUN: 13 mg/dL (ref 6–20)
CO2: 24 mmol/L (ref 22–32)
Calcium: 8.3 mg/dL — ABNORMAL LOW (ref 8.9–10.3)
Chloride: 100 mmol/L (ref 98–111)
Creatinine, Ser: 0.82 mg/dL (ref 0.61–1.24)
GFR, Estimated: >60 mL/min (ref >60)
Glucose, Bld: 97 mg/dL (ref 70–99)
Potassium: 4.3 mmol/L (ref 3.5–5.1)
Sodium: 135 mmol/L (ref 135–145)
Total Bilirubin: 0.7 mg/dL (ref 0.3–1.2)
Total Protein: 5.9 g/dL — ABNORMAL LOW (ref 6.5–8.1)

## 2022-04-27 LAB — CBC
HCT: 45.5 % (ref 39.0–52.0)
Hemoglobin: 15.6 g/dL (ref 13.0–17.0)
MCH: 31.6 pg (ref 26.0–34.0)
MCHC: 34.3 g/dL (ref 30.0–36.0)
MCV: 92.3 fL (ref 80.0–100.0)
Platelets: 251 10*3/uL (ref 150–400)
RBC: 4.93 MIL/uL (ref 4.22–5.81)
RDW: 12.6 % (ref 11.5–15.5)
WBC: 9.7 10*3/uL (ref 4.0–10.5)
nRBC: 0 % (ref 0.0–0.2)

## 2022-04-27 LAB — LIPID PANEL
Cholesterol: 117 mg/dL (ref 0–200)
HDL: 27 mg/dL — ABNORMAL LOW (ref >40)
LDL Cholesterol: 69 mg/dL (ref 0–99)
Total CHOL/HDL Ratio: 4.3 RATIO
Triglycerides: 107 mg/dL (ref <150)
VLDL: 21 mg/dL (ref 0–40)

## 2022-04-27 MED ORDER — NALOXONE HCL 0.4 MG/ML IJ SOLN: 0.4000 mg | INTRAMUSCULAR | Status: DC | PRN

## 2022-04-27 MED ORDER — AZITHROMYCIN 500 MG PO TABS
500.0000 mg | ORAL_TABLET | Freq: Every day | ORAL | Status: DC
  Administered 2022-04-27 – 2022-04-28 (×2): 500 mg via ORAL
  Filled 2022-04-27 (×2): qty 1

## 2022-04-27 MED ORDER — CEFDINIR 300 MG PO CAPS
300.0000 mg | ORAL_CAPSULE | Freq: Two times a day (BID) | ORAL | Status: DC
  Administered 2022-04-27 – 2022-04-28 (×2): 300 mg via ORAL
  Filled 2022-04-27 (×2): qty 1

## 2022-04-27 NOTE — Plan of Care (Signed)

## 2022-04-27 NOTE — Progress Notes (Signed)
[facility] PROGRESS NOTE   Daniel [name] WUJ:[id number] DOB: November 11, 1995 DOA: 04/24/2022  PCP: Maury Dus, MD  Brief History/Interval Summary: 26 y.o. male with medical history significant for ADHD who presented to the ED for evaluation of cough.  Patient was found to have multifocal pneumonia.  He was noted to be hypoxic with saturations in the mid 70s on room air.  He was hospitalized for further management.  Consultants: None  Procedures: None    Subjective/Interval History: Patient mentions that he continues to feel better.  Coughing less than before.  Denies any chest pain.  No nausea or vomiting.  Has ambulated in the hallway.  His sats did drop into the 80s overnight.     Assessment/Plan:  Community-acquired pneumonia/sepsis present on admission Patient met sepsis criteria at admission with leukocytosis tachycardia findings of multifocal pneumonia on imaging studies. COVID-19 and influenza PCR negative.  Respiratory viral panel is negative.  Strep pneumoniae urinary antigen is negative. Blood cultures are negative so far. Changed to [facility] and oral azithromycin. WBC continues to improve.  To mobilize. Repeat chest x-ray.  Acute respiratory failure with hypoxia Secondary to pneumonia.   Seems to be improving.  He did drop his saturations into the 80s overnight on room air.  Continue with incentive spirometry.  Mobilize.  Hopefully we can wean him off of oxygen over the next 24 hours.  CT angiogram was negative for PE.    Elevated troponin Patient denies any chest pain.  EKG shows nonspecific changes.  BNP was noted to be mildly elevated.  Echocardiogram shows low normal left ventricular EF without any regional wall motion abnormalities.  Findings did suggest right ventricular pressure overload and RV strain.   As mentioned above CT scan did not show any pulmonary embolism. His elevated troponins could be due to demand ischemia.   He will benefit  from cardiology evaluation but this can be pursued in the outpatient setting.  Will refer him to cardiology at the time of discharge.   LDL noted to be 69.  Blood pressure is reasonably well-controlled.  Acute kidney injury Likely prerenal.  Was given IV fluids in the emergency department.  Renal function is now back to baseline.  Transaminitis Mildly elevated AST and ALT level noted.  Possibly related to sepsis.   LFTs gradually improving.  Hepatitis panel is unremarkable.  Hepatobiliary ultrasound was unremarkable.  Obesity Estimated body mass index is 30.17 kg/m as calculated from the following:   Height as of 10/06/19: _0  (1.88 m).   Weight as of 10/06/19: 106.6 kg.  DVT Prophylaxis: Lovenox Code Status: Full code Family Communication: Discussed with patient Disposition Plan: Hopefully return home when improved.  Start mobilizing.  Status is: Inpatient Remains inpatient appropriate because: Acute respiratory failure with hypoxia      Medications: Scheduled:  azithromycin  500 mg Oral Daily   cefdinir  300 mg Oral Q12H   enoxaparin (LOVENOX) injection  40 mg Subcutaneous Q24H   sodium chloride flush  3 mL Intravenous Q12H   Continuous:   NFA:OZHYQMVHQIONG **OR** acetaminophen, ondansetron **OR** ondansetron (ZOFRAN) IV, senna-docusate  Antibiotics: Anti-infectives (From admission, onward)    Start     Dose/Rate Route Frequency Ordered Stop   04/27/22 2200  cefdinir (OMNICEF) capsule 300 mg        300 mg Oral Every 12 hours 04/27/22 0856 05/01/22 2159   04/27/22 1000  azithromycin (ZITHROMAX) tablet 500 mg        500 mg Oral Daily  04/27/22 0856 04/30/22 0959   04/25/22 1000  azithromycin (ZITHROMAX) 500 mg in sodium chloride 0.9 % 250 mL IVPB  Status:  Discontinued        500 mg 250 mL/hr over 60 Minutes Intravenous Every 24 hours 04/25/22 0123 04/27/22 0856   04/25/22 1000  cefTRIAXone (ROCEPHIN) 2 g in sodium chloride 0.9 % 100 mL IVPB  Status:  Discontinued         2 g 200 mL/hr over 30 Minutes Intravenous Every 24 hours 04/25/22 0123 04/27/22 0856   04/25/22 0000  cefTRIAXone (ROCEPHIN) 1 g in sodium chloride 0.9 % 100 mL IVPB        1 g 200 mL/hr over 30 Minutes Intravenous  Once 04/24/22 2346 04/25/22 0049   04/25/22 0000  azithromycin (ZITHROMAX) 500 mg in sodium chloride 0.9 % 250 mL IVPB        500 mg 250 mL/hr over 60 Minutes Intravenous  Once 04/24/22 2346 04/25/22 0120       Objective:  Vital Signs  Vitals:   04/26/22 2000 04/26/22 2324 04/27/22 0300 04/27/22 0800  BP: 127/79 124/82 134/88 (!) 127/93  Pulse: 84 67 65 70  Resp: 19 17 (!) 22 17  Temp: 98.4 F (36.9 C) 98.6 F (37 C) 98.2 F (36.8 C)   TempSrc: Oral Oral Oral   SpO2: 95% 96% 96% 94%    Intake/Output Summary (Last 24 hours) at 04/27/2022 0920 Last data filed at 04/26/2022 1523 Gross per 24 hour  Intake 709.66 ml  Output --  Net 709.66 ml    There were no vitals filed for this visit.  General appearance: Awake alert.  In no distress Resp: Normal effort at rest.  Improved air entry bilaterally.  No wheezing or rhonchi.  Few crackles at the bases. Cardio: S1-S2 is normal regular.  No S3-S4.  No rubs murmurs or bruit GI: Abdomen is soft.  Nontender nondistended.  Bowel sounds are present normal.  No masses organomegaly Extremities: No edema.  Full range of motion of lower extremities. Neurologic: Alert and oriented x3.  No focal neurological deficits.   Lab Results:  Data Reviewed: I have personally reviewed following labs and reports of the imaging studies  CBC: Recent Labs  Lab 04/24/22 2145 04/25/22 0325 04/26/22 0247 04/27/22 0251  WBC 22.9* 22.6* 15.1* 9.7  NEUTROABS 17.2*  --   --   --   HGB 17.1* 16.9 15.5 15.6  HCT 51.3 49.9 46.6 45.5  MCV 94.1 94.2 94.3 92.3  PLT 281 278 251 251     Basic Metabolic Panel: Recent Labs  Lab 04/24/22 2145 04/25/22 0325 04/26/22 0247 04/27/22 0251  NA 137 139 136 135  K 4.7 4.5 4.0 4.3  CL 96*  95* 98 100  CO2 _0 GLUCOSE 116* 135* 104* 97  BUN 24* 21* 16 13  CREATININE 1.50* 1.29* 0.87 0.82  CALCIUM 8.4* 8.8* 8.7* 8.3*  MG  --   --  1.9  --      GFR: CrCl cannot be calculated (Unknown ideal weight.).  Liver Function Tests: Recent Labs  Lab 04/24/22 2145 04/25/22 0325 04/26/22 0247 04/27/22 0251  AST 124* 116* 78* 48*  ALT 91* 97* 107* 99*  ALKPHOS 104 104 85 74  BILITOT 0.6 0.6 0.4 0.7  PROT 6.5 6.7 6.4* 5.9*  ALBUMIN 3.4* 3.4* 3.1* 2.9*     Recent Labs  Lab 04/24/22 2145  LIPASE 20      Recent Results (  from the past 240 hour(s))  Resp Panel by RT-PCR (Flu A&B, Covid) Anterior Nasal Swab     Status: None   Collection Time: 04/24/22  8:25 PM   Specimen: Anterior Nasal Swab  Result Value Ref Range Status   SARS Coronavirus 2 by RT PCR NEGATIVE NEGATIVE Final    Comment: (NOTE) SARS-CoV-2 target nucleic acids are NOT DETECTED.  The SARS-CoV-2 RNA is generally detectable in upper respiratory specimens during the acute phase of infection. The lowest concentration of SARS-CoV-2 viral copies this assay can detect is 138 copies/mL. A negative result does not preclude SARS-Cov-2 infection and should not be used as the sole basis for treatment or other patient management decisions. A negative result may occur with  improper specimen collection/handling, submission of specimen other than nasopharyngeal swab, presence of viral mutation(s) within the areas targeted by this assay, and inadequate number of viral copies(<138 copies/mL). A negative result must be combined with clinical observations, patient history, and epidemiological information. The expected result is Negative.  Fact Sheet for Patients:  EntrepreneurPulse.com.au  Fact Sheet for Healthcare Providers:  IncredibleEmployment.be  This test is no t yet approved or cleared by the [place] FDA and  has been authorized for detection and/or  diagnosis of SARS-CoV-2 by FDA under an Emergency Use Authorization (EUA). This EUA will remain  in effect (meaning this test can be used) for the duration of the COVID-19 declaration under Section 564(b)(1) of the Act, 21 U.S.C.section 360bbb-3(b)(1), unless the authorization is terminated  or revoked sooner.       Influenza A by PCR NEGATIVE NEGATIVE Final   Influenza B by PCR NEGATIVE NEGATIVE Final    Comment: (NOTE) The Xpert Xpress SARS-CoV-2/FLU/RSV plus assay is intended as an aid in the diagnosis of influenza from Nasopharyngeal swab specimens and should not be used as a sole basis for treatment. Nasal washings and aspirates are unacceptable for Xpert Xpress SARS-CoV-2/FLU/RSV testing.  Fact Sheet for Patients: EntrepreneurPulse.com.au  Fact Sheet for Healthcare Providers: IncredibleEmployment.be  This test is not yet approved or cleared by the [place] FDA and has been authorized for detection and/or diagnosis of SARS-CoV-2 by FDA under an Emergency Use Authorization (EUA). This EUA will remain in effect (meaning this test can be used) for the duration of the COVID-19 declaration under Section 564(b)(1) of the Act, 21 U.S.C. section 360bbb-3(b)(1), unless the authorization is terminated or revoked.  Performed at [facility], [place] [address] [place], [place] [address]   Respiratory (~20 pathogens) panel by PCR     Status: None   Collection Time: 04/25/22  1:22 AM   Specimen: Nasopharyngeal Swab; Respiratory  Result Value Ref Range Status   Adenovirus NOT DETECTED NOT DETECTED Final   Coronavirus 229E NOT DETECTED NOT DETECTED Final    Comment: (NOTE) The Coronavirus on the Respiratory Panel, DOES NOT test for the novel  Coronavirus (2019 nCoV)    Coronavirus HKU1 NOT DETECTED NOT DETECTED Final   Coronavirus NL63 NOT DETECTED NOT DETECTED Final   Coronavirus OC43 NOT DETECTED NOT DETECTED Final   Metapneumovirus  NOT DETECTED NOT DETECTED Final   Rhinovirus / Enterovirus NOT DETECTED NOT DETECTED Final   Influenza A NOT DETECTED NOT DETECTED Final   Influenza B NOT DETECTED NOT DETECTED Final   Parainfluenza Virus 1 NOT DETECTED NOT DETECTED Final   Parainfluenza Virus 2 NOT DETECTED NOT DETECTED Final   Parainfluenza Virus 3 NOT DETECTED NOT DETECTED Final   Parainfluenza Virus 4 NOT DETECTED  NOT DETECTED Final   Respiratory Syncytial Virus NOT DETECTED NOT DETECTED Final   Bordetella pertussis NOT DETECTED NOT DETECTED Final   Bordetella Parapertussis NOT DETECTED NOT DETECTED Final   Chlamydophila pneumoniae NOT DETECTED NOT DETECTED Final   Mycoplasma pneumoniae NOT DETECTED NOT DETECTED Final    Comment: Performed at [facility], [place] [address], [place], [place] [address]  Culture, blood (Routine X 2) w Reflex to ID Panel     Status: None (Preliminary result)   Collection Time: 04/25/22  3:25 AM   Specimen: BLOOD RIGHT FOREARM  Result Value Ref Range Status   Specimen Description BLOOD RIGHT FOREARM  Final   Special Requests   Final    BOTTLES DRAWN AEROBIC AND ANAEROBIC Blood Culture adequate volume   Culture   Final    NO GROWTH 2 DAYS Performed at [facility], [place] [address], [place], [place] [address]    Report Status PENDING  Incomplete  Culture, blood (Routine X 2) w Reflex to ID Panel     Status: None (Preliminary result)   Collection Time: 04/25/22  5:34 PM   Specimen: BLOOD LEFT ARM  Result Value Ref Range Status   Specimen Description BLOOD LEFT ARM  Final   Special Requests   Final    BOTTLES DRAWN AEROBIC AND ANAEROBIC Blood Culture adequate volume   Culture   Final    NO GROWTH 2 DAYS Performed at [facility], [place] [address], [place], [place] [address]    Report Status PENDING  Incomplete      Radiology Studies: US Abdomen Limited RUQ (LIVER/GB)  Result Date: 04/26/2022 CLINICAL DATA:  Elevated LFT EXAM: ULTRASOUND ABDOMEN LIMITED RIGHT UPPER  QUADRANT COMPARISON:  None Available. FINDINGS: Gallbladder: No gallstones or wall thickening visualized. No sonographic Murphy sign noted by sonographer. Common bile duct: Diameter: 2.6 mm Liver: No focal lesion identified. Within normal limits in parenchymal echogenicity. Portal vein is patent on color Doppler imaging with normal direction of blood flow towards the liver. Other: None. IMPRESSION: Negative examination Electronically Signed   By: Donavan Foil M.D.   On: 04/26/2022 19:27   ECHOCARDIOGRAM COMPLETE  Result Date: 04/25/2022    ECHOCARDIOGRAM REPORT   Patient Name:   ADAIAH MORKEN Date of Exam: 04/25/2022 Medical Rec #:  [id number]                  Height:       74.0 in Accession #:    [id number]                 Weight:       235.0 lb Date of Birth:  May 31, 1996                 BSA:          2.328 m Patient Age:    26 years                   BP:           131/98 mmHg Patient Gender: M                          HR:           88 bpm. Exam Location:  Inpatient Procedure: 2D Echo Indications:    elevated troponin  History:        Patient has no prior history of Echocardiogram examinations.  Risk Factors:Current Smoker.  Sonographer:    Johny Chess RDCS Referring Phys: [id number] VISHAL R PATEL  Sonographer Comments: Image acquisition challenging due to patient body habitus. IMPRESSIONS  1. Left ventricular ejection fraction, by estimation, is 50 to 55%. The left ventricle has low normal function. The left ventricle has no regional wall motion abnormalities. There is mild left ventricular hypertrophy. Left ventricular diastolic parameters were normal. There is the interventricular septum is flattened in systole, consistent with right ventricular pressure overload. Suggestive of RV strain.  2. Right ventricular systolic function was not well visualized. The right ventricular size is normal. Tricuspid regurgitation signal is inadequate for assessing PA pressure.  3. The mitral  valve is normal in structure. No evidence of mitral valve regurgitation. No evidence of mitral stenosis.  4. The aortic valve is tricuspid. Aortic valve regurgitation is not visualized. No aortic stenosis is present. Comparison(s): No prior Echocardiogram. FINDINGS  Left Ventricle: Left ventricular ejection fraction, by estimation, is 50 to 55%. The left ventricle has low normal function. The left ventricle has no regional wall motion abnormalities. The left ventricular internal cavity size was normal in size. There is mild left ventricular hypertrophy. The interventricular septum is flattened in systole, consistent with right ventricular pressure overload. Left ventricular diastolic parameters were normal. Right Ventricle: The right ventricular size is normal. No increase in right ventricular wall thickness. Right ventricular systolic function was not well visualized. Tricuspid regurgitation signal is inadequate for assessing PA pressure. Left Atrium: Left atrial size was normal in size. Right Atrium: Right atrial size was normal in size. Pericardium: There is no evidence of pericardial effusion. Presence of epicardial fat layer. Mitral Valve: The mitral valve is normal in structure. No evidence of mitral valve regurgitation. No evidence of mitral valve stenosis. Tricuspid Valve: The tricuspid valve is normal in structure. Tricuspid valve regurgitation is not demonstrated. No evidence of tricuspid stenosis. Aortic Valve: The aortic valve is tricuspid. Aortic valve regurgitation is not visualized. No aortic stenosis is present. Pulmonic Valve: The pulmonic valve was not well visualized. Pulmonic valve regurgitation is not visualized. No evidence of pulmonic stenosis. Aorta: The aortic root and ascending aorta are structurally normal, with no evidence of dilitation. IAS/Shunts: No atrial level shunt detected by color flow Doppler.  LEFT VENTRICLE PLAX 2D LVIDd:         4.20 cm     Diastology LVIDs:         3.00 cm      LV e' medial:  7.29 cm/s LV PW:         1.00 cm     LV e' lateral: 12.70 cm/s LV IVS:        1.10 cm LVOT diam:     2.10 cm LV SV:         48 LV SV Index:   21 LVOT Area:     3.46 cm  LV Volumes (MOD) LV vol d, MOD A4C: 77.3 ml LV vol s, MOD A4C: 37.6 ml LV SV MOD A4C:     77.3 ml RIGHT VENTRICLE             IVC RV S prime:     13.30 cm/s  IVC diam: 1.60 cm TAPSE (M-mode): 1.9 cm LEFT ATRIUM             Index        RIGHT ATRIUM           Index LA diam:  2.90 cm 1.25 cm/m   RA Area:     13.10 cm LA Vol (A2C):   33.0 ml 14.18 ml/m  RA Volume:   32.40 ml  13.92 ml/m LA Vol (A4C):   26.3 ml 11.30 ml/m LA Biplane Vol: 30.4 ml 13.06 ml/m  AORTIC VALVE LVOT Vmax:   85.90 cm/s LVOT Vmean:  55.800 cm/s LVOT VTI:    0.139 m  AORTA Ao Root diam: 3.20 cm Ao Asc diam:  2.80 cm  SHUNTS Systemic VTI:  0.14 m Systemic Diam: 2.10 cm Rudean Haskell MD Electronically signed by Rudean Haskell MD Signature Date/Time: 04/25/2022/1:10:30 PM    Final        LOS: 2 days   [facility] Pager on [email or web address]  04/27/2022, 9:20 AM

## 2022-04-28 DIAGNOSIS — J189 Pneumonia, unspecified organism: Secondary | ICD-10-CM | POA: Diagnosis not present

## 2022-04-28 DIAGNOSIS — A419 Sepsis, unspecified organism: Secondary | ICD-10-CM | POA: Diagnosis not present

## 2022-04-28 LAB — COMPREHENSIVE METABOLIC PANEL
ALT: 87 U/L — ABNORMAL HIGH (ref 0–44)
AST: 32 U/L (ref 15–41)
Albumin: 3.2 g/dL — ABNORMAL LOW (ref 3.5–5.0)
Alkaline Phosphatase: 70 U/L (ref 38–126)
Anion gap: 10 (ref 5–15)
BUN: 14 mg/dL (ref 6–20)
CO2: 25 mmol/L (ref 22–32)
Calcium: 8.5 mg/dL — ABNORMAL LOW (ref 8.9–10.3)
Chloride: 100 mmol/L (ref 98–111)
Creatinine, Ser: 0.82 mg/dL (ref 0.61–1.24)
GFR, Estimated: >60 mL/min (ref >60)
Glucose, Bld: 101 mg/dL — ABNORMAL HIGH (ref 70–99)
Potassium: 4.5 mmol/L (ref 3.5–5.1)
Sodium: 135 mmol/L (ref 135–145)
Total Bilirubin: 0.5 mg/dL (ref 0.3–1.2)
Total Protein: 6.2 g/dL — ABNORMAL LOW (ref 6.5–8.1)

## 2022-04-28 LAB — LEGIONELLA PNEUMOPHILA SEROGP 1 UR AG: L. pneumophila Serogp 1 Ur Ag: NEGATIVE

## 2022-04-28 MED ORDER — CEFDINIR 300 MG PO CAPS: 300.0000 mg | ORAL_CAPSULE | Freq: Two times a day (BID) | ORAL | 0 refills | Status: AC

## 2022-04-28 MED ORDER — AZITHROMYCIN 500 MG PO TABS: 500.0000 mg | ORAL_TABLET | Freq: Every day | ORAL | 0 refills | Status: AC

## 2022-04-28 NOTE — Discharge Summary (Signed)
[facility]  Physician Discharge Summary   Patient ID: JAION LAGRANGE MRN: [id number] DOB/AGE: 08-06-1995 26 y.o.  Admit date: 04/24/2022 Discharge date: 04/28/2022    PCP: Maury Dus, MD  DISCHARGE DIAGNOSES:  Community-acquired pneumonia   Sepsis due to pneumonia ([place])   Elevated troponin   Acute respiratory failure with hypoxia ([place])   AKI (acute kidney injury) ([place])   RECOMMENDATIONS FOR OUTPATIENT FOLLOW UP: Message sent to cardiology to arrange outpatient follow-up    Home Health: None Equipment/Devices: None  CODE STATUS: Full  DISCHARGE CONDITION: fair  Diet recommendation: As before  INITIAL HISTORY: 26 y.o. male with medical history significant for ADHD who presented to the ED for evaluation of cough.  Patient was found to have multifocal pneumonia.  He was noted to be hypoxic with saturations in the mid 70s on room air.  He was hospitalized for further management.    HOSPITAL COURSE:   Community-acquired pneumonia/sepsis present on admission Patient met sepsis criteria at admission with leukocytosis tachycardia findings of multifocal pneumonia on imaging studies. COVID-19 and influenza PCR negative.  Respiratory viral panel is negative.  Strep pneumoniae urinary antigen is negative. Blood cultures are negative so far. Changed to [facility] and oral azithromycin. WBC continues to improve.  Repeat chest x-ray shows significant improvement in the pneumonia.   Acute respiratory failure with hypoxia Secondary to pneumonia.  Has been weaned off of oxygen. CT angiogram was negative for PE.     Elevated troponin Patient denied any chest pain.  EKG shows nonspecific changes.  BNP was noted to be mildly elevated.   Echocardiogram shows low normal left ventricular EF without any regional wall motion abnormalities.  Findings did suggest right ventricular pressure overload and RV strain.   As mentioned above CT scan did not show any pulmonary  embolism. His elevated troponins could be due to demand ischemia.   He will benefit from cardiology evaluation but this can be pursued in the outpatient setting.  Message sent to [facility] cardiovascular to arrange follow-up.  LDL noted to be 69.  Blood pressure is reasonably well-controlled.   Acute kidney injury Likely prerenal.  Was given IV fluids in the emergency department.  Renal function is now back to baseline.   Transaminitis Mildly elevated AST and ALT level noted.  Possibly related to sepsis.   LFTs gradually improving.  Hepatitis panel is unremarkable.  Hepatobiliary ultrasound was unremarkable.   Obesity Estimated body mass index is 30.17 kg/m as calculated from the following:   Height as of 10/06/19: _0  (1.88 m).   Weight as of 10/06/19: 106.6 kg.  Patient is stable.  Okay for discharge home today.   PERTINENT LABS:  The results of significant diagnostics from this hospitalization (including imaging, microbiology, ancillary and laboratory) are listed below for reference.    Microbiology: Recent Results (from the past 240 hour(s))  Resp Panel by RT-PCR (Flu A&B, Covid) Anterior Nasal Swab     Status: None   Collection Time: 04/24/22  8:25 PM   Specimen: Anterior Nasal Swab  Result Value Ref Range Status   SARS Coronavirus 2 by RT PCR NEGATIVE NEGATIVE Final    Comment: (NOTE) SARS-CoV-2 target nucleic acids are NOT DETECTED.  The SARS-CoV-2 RNA is generally detectable in upper respiratory specimens during the acute phase of infection. The lowest concentration of SARS-CoV-2 viral copies this assay can detect is 138 copies/mL. A negative result does not preclude SARS-Cov-2 infection and should not be used as the sole basis for  treatment or other patient management decisions. A negative result may occur with  improper specimen collection/handling, submission of specimen other than nasopharyngeal swab, presence of viral mutation(s) within the areas targeted by this  assay, and inadequate number of viral copies(<138 copies/mL). A negative result must be combined with clinical observations, patient history, and epidemiological information. The expected result is Negative.  Fact Sheet for Patients:  EntrepreneurPulse.com.au  Fact Sheet for Healthcare Providers:  IncredibleEmployment.be  This test is no t yet approved or cleared by the [place] FDA and  has been authorized for detection and/or diagnosis of SARS-CoV-2 by FDA under an Emergency Use Authorization (EUA). This EUA will remain  in effect (meaning this test can be used) for the duration of the COVID-19 declaration under Section 564(b)(1) of the Act, 21 U.S.C.section 360bbb-3(b)(1), unless the authorization is terminated  or revoked sooner.       Influenza A by PCR NEGATIVE NEGATIVE Final   Influenza B by PCR NEGATIVE NEGATIVE Final    Comment: (NOTE) The Xpert Xpress SARS-CoV-2/FLU/RSV plus assay is intended as an aid in the diagnosis of influenza from Nasopharyngeal swab specimens and should not be used as a sole basis for treatment. Nasal washings and aspirates are unacceptable for Xpert Xpress SARS-CoV-2/FLU/RSV testing.  Fact Sheet for Patients: EntrepreneurPulse.com.au  Fact Sheet for Healthcare Providers: IncredibleEmployment.be  This test is not yet approved or cleared by the [place] FDA and has been authorized for detection and/or diagnosis of SARS-CoV-2 by FDA under an Emergency Use Authorization (EUA). This EUA will remain in effect (meaning this test can be used) for the duration of the COVID-19 declaration under Section 564(b)(1) of the Act, 21 U.S.C. section 360bbb-3(b)(1), unless the authorization is terminated or revoked.  Performed at [facility], [place] [address] [place], [place] [address]   Respiratory (~20 pathogens) panel by PCR     Status: None   Collection Time:  04/25/22  1:22 AM   Specimen: Nasopharyngeal Swab; Respiratory  Result Value Ref Range Status   Adenovirus NOT DETECTED NOT DETECTED Final   Coronavirus 229E NOT DETECTED NOT DETECTED Final    Comment: (NOTE) The Coronavirus on the Respiratory Panel, DOES NOT test for the novel  Coronavirus (2019 nCoV)    Coronavirus HKU1 NOT DETECTED NOT DETECTED Final   Coronavirus NL63 NOT DETECTED NOT DETECTED Final   Coronavirus OC43 NOT DETECTED NOT DETECTED Final   Metapneumovirus NOT DETECTED NOT DETECTED Final   Rhinovirus / Enterovirus NOT DETECTED NOT DETECTED Final   Influenza A NOT DETECTED NOT DETECTED Final   Influenza B NOT DETECTED NOT DETECTED Final   Parainfluenza Virus 1 NOT DETECTED NOT DETECTED Final   Parainfluenza Virus 2 NOT DETECTED NOT DETECTED Final   Parainfluenza Virus 3 NOT DETECTED NOT DETECTED Final   Parainfluenza Virus 4 NOT DETECTED NOT DETECTED Final   Respiratory Syncytial Virus NOT DETECTED NOT DETECTED Final   Bordetella pertussis NOT DETECTED NOT DETECTED Final   Bordetella Parapertussis NOT DETECTED NOT DETECTED Final   Chlamydophila pneumoniae NOT DETECTED NOT DETECTED Final   Mycoplasma pneumoniae NOT DETECTED NOT DETECTED Final    Comment: Performed at [facility], [place]. [address] [place], [place] [address]  Culture, blood (Routine X 2) w Reflex to ID Panel     Status: None (Preliminary result)   Collection Time: 04/25/22  3:25 AM   Specimen: BLOOD RIGHT FOREARM  Result Value Ref Range Status   Specimen Description BLOOD RIGHT FOREARM  Final  Special Requests   Final    BOTTLES DRAWN AEROBIC AND ANAEROBIC Blood Culture adequate volume   Culture   Final    NO GROWTH 3 DAYS Performed at [facility], [place] [address] [place], [place] [address]    Report Status PENDING  Incomplete  Culture, blood (Routine X 2) w Reflex to ID Panel     Status: None (Preliminary result)   Collection Time: 04/25/22  5:34 PM   Specimen: BLOOD LEFT ARM  Result  Value Ref Range Status   Specimen Description BLOOD LEFT ARM  Final   Special Requests   Final    BOTTLES DRAWN AEROBIC AND ANAEROBIC Blood Culture adequate volume   Culture   Final    NO GROWTH 3 DAYS Performed at [facility], [place] [address], [place], [place] [address]    Report Status PENDING  Incomplete     Labs:   Basic Metabolic Panel: Recent Labs  Lab 04/24/22 2145 04/25/22 0325 04/26/22 0247 04/27/22 0251 04/28/22 0257  NA 137 139 136 135 135  K 4.7 4.5 4.0 4.3 4.5  CL 96* 95* 98 100 100  CO2 _0 GLUCOSE 116* 135* 104* 97 101*  BUN 24* 21* _1 CREATININE 1.50* 1.29* 0.87 0.82 0.82  CALCIUM 8.4* 8.8* 8.7* 8.3* 8.5*  MG  --   --  1.9  --   --    Liver Function Tests: Recent Labs  Lab 04/24/22 2145 04/25/22 0325 04/26/22 0247 04/27/22 0251 04/28/22 0257  AST 124* 116* 78* 48* 32  ALT 91* 97* 107* 99* 87*  ALKPHOS 104 104 85 74 70  BILITOT 0.6 0.6 0.4 0.7 0.5  PROT 6.5 6.7 6.4* 5.9* 6.2*  ALBUMIN 3.4* 3.4* 3.1* 2.9* 3.2*   Recent Labs  Lab 04/24/22 2145  LIPASE 20    CBC: Recent Labs  Lab 04/24/22 2145 04/25/22 0325 04/26/22 0247 04/27/22 0251  WBC 22.9* 22.6* 15.1* 9.7  NEUTROABS 17.2*  --   --   --   HGB 17.1* 16.9 15.5 15.6  HCT 51.3 49.9 46.6 45.5  MCV 94.1 94.2 94.3 92.3  PLT 281 278 251 251    BNP: BNP (last 3 results) Recent Labs    04/24/22 2145  BNP 419.1*     IMAGING STUDIES DG CHEST PORT 1 VIEW  Result Date: 04/27/2022 CLINICAL DATA:  Pneumonia. EXAM: PORTABLE CHEST 1 VIEW COMPARISON:  04/24/2022 FINDINGS: 0824 hours. Marked improvement in exam with interval resolution of the bilateral patchy airspace disease seen previously. Cardiopericardial silhouette is at upper limits of normal for size. The visualized bony structures of the thorax are unremarkable. Telemetry leads overlie the chest. IMPRESSION: Marked improvement in exam with interval resolution of the bilateral patchy airspace disease seen  previously. Electronically Signed   By: Misty Stanley M.D.   On: 04/27/2022 09:54   US Abdomen Limited RUQ (LIVER/GB)  Result Date: 04/26/2022 CLINICAL DATA:  Elevated LFT EXAM: ULTRASOUND ABDOMEN LIMITED RIGHT UPPER QUADRANT COMPARISON:  None Available. FINDINGS: Gallbladder: No gallstones or wall thickening visualized. No sonographic Murphy sign noted by sonographer. Common bile duct: Diameter: 2.6 mm Liver: No focal lesion identified. Within normal limits in parenchymal echogenicity. Portal vein is patent on color Doppler imaging with normal direction of blood flow towards the liver. Other: None. IMPRESSION: Negative examination Electronically Signed   By: Donavan Foil M.D.   On: 04/26/2022 19:27   ECHOCARDIOGRAM COMPLETE  Result Date: 04/25/2022  ECHOCARDIOGRAM REPORT   Patient Name:   RANDE ROYLANCE Date of Exam: 04/25/2022 Medical Rec #:  [id number]                  Height:       74.0 in Accession #:    [id number]                 Weight:       235.0 lb Date of Birth:  11-06-1995                 BSA:          2.328 m Patient Age:    26 years                   BP:           131/98 mmHg Patient Gender: M                          HR:           88 bpm. Exam Location:  Inpatient Procedure: 2D Echo Indications:    elevated troponin  History:        Patient has no prior history of Echocardiogram examinations.                 Risk Factors:Current Smoker.  Sonographer:    Johny Chess RDCS Referring Phys: [id number] VISHAL R PATEL  Sonographer Comments: Image acquisition challenging due to patient body habitus. IMPRESSIONS  1. Left ventricular ejection fraction, by estimation, is 50 to 55%. The left ventricle has low normal function. The left ventricle has no regional wall motion abnormalities. There is mild left ventricular hypertrophy. Left ventricular diastolic parameters were normal. There is the interventricular septum is flattened in systole, consistent with right ventricular pressure  overload. Suggestive of RV strain.  2. Right ventricular systolic function was not well visualized. The right ventricular size is normal. Tricuspid regurgitation signal is inadequate for assessing PA pressure.  3. The mitral valve is normal in structure. No evidence of mitral valve regurgitation. No evidence of mitral stenosis.  4. The aortic valve is tricuspid. Aortic valve regurgitation is not visualized. No aortic stenosis is present. Comparison(s): No prior Echocardiogram. FINDINGS  Left Ventricle: Left ventricular ejection fraction, by estimation, is 50 to 55%. The left ventricle has low normal function. The left ventricle has no regional wall motion abnormalities. The left ventricular internal cavity size was normal in size. There is mild left ventricular hypertrophy. The interventricular septum is flattened in systole, consistent with right ventricular pressure overload. Left ventricular diastolic parameters were normal. Right Ventricle: The right ventricular size is normal. No increase in right ventricular wall thickness. Right ventricular systolic function was not well visualized. Tricuspid regurgitation signal is inadequate for assessing PA pressure. Left Atrium: Left atrial size was normal in size. Right Atrium: Right atrial size was normal in size. Pericardium: There is no evidence of pericardial effusion. Presence of epicardial fat layer. Mitral Valve: The mitral valve is normal in structure. No evidence of mitral valve regurgitation. No evidence of mitral valve stenosis. Tricuspid Valve: The tricuspid valve is normal in structure. Tricuspid valve regurgitation is not demonstrated. No evidence of tricuspid stenosis. Aortic Valve: The aortic valve is tricuspid. Aortic valve regurgitation is not visualized. No aortic stenosis is present. Pulmonic Valve: The pulmonic valve was not well visualized. Pulmonic valve regurgitation is not visualized. No evidence of pulmonic stenosis. Aorta: The  aortic root and  ascending aorta are structurally normal, with no evidence of dilitation. IAS/Shunts: No atrial level shunt detected by color flow Doppler.  LEFT VENTRICLE PLAX 2D LVIDd:         4.20 cm     Diastology LVIDs:         3.00 cm     LV e' medial:  7.29 cm/s LV PW:         1.00 cm     LV e' lateral: 12.70 cm/s LV IVS:        1.10 cm LVOT diam:     2.10 cm LV SV:         48 LV SV Index:   21 LVOT Area:     3.46 cm  LV Volumes (MOD) LV vol d, MOD A4C: 77.3 ml LV vol s, MOD A4C: 37.6 ml LV SV MOD A4C:     77.3 ml RIGHT VENTRICLE             IVC RV S prime:     13.30 cm/s  IVC diam: 1.60 cm TAPSE (M-mode): 1.9 cm LEFT ATRIUM             Index        RIGHT ATRIUM           Index LA diam:        2.90 cm 1.25 cm/m   RA Area:     13.10 cm LA Vol (A2C):   33.0 ml 14.18 ml/m  RA Volume:   32.40 ml  13.92 ml/m LA Vol (A4C):   26.3 ml 11.30 ml/m LA Biplane Vol: 30.4 ml 13.06 ml/m  AORTIC VALVE LVOT Vmax:   85.90 cm/s LVOT Vmean:  55.800 cm/s LVOT VTI:    0.139 m  AORTA Ao Root diam: 3.20 cm Ao Asc diam:  2.80 cm  SHUNTS Systemic VTI:  0.14 m Systemic Diam: 2.10 cm Rudean Haskell MD Electronically signed by Rudean Haskell MD Signature Date/Time: 04/25/2022/1:10:30 PM    Final    CT Angio Chest PE W and/or Wo Contrast  Result Date: 04/24/2022 CLINICAL DATA:  Pulmonary embolism (PE) suspected, low to intermediate prob, positive D-dimer Cough. EXAM: CT ANGIOGRAPHY CHEST WITH CONTRAST TECHNIQUE: Multidetector CT imaging of the chest was performed using the standard protocol during bolus administration of intravenous contrast. Multiplanar CT image reconstructions and MIPs were obtained to evaluate the vascular anatomy. RADIATION DOSE REDUCTION: This exam was performed according to the departmental dose-optimization program which includes automated exposure control, adjustment of the mA and/or kV according to patient size and/or use of iterative reconstruction technique. CONTRAST:  25m OMNIPAQUE IOHEXOL 350 MG/ML  SOLN COMPARISON:  Radiograph earlier today. FINDINGS: Cardiovascular: There are no filling defects within the pulmonary arteries to suggest pulmonary embolus. The heart is upper normal in size. Normal thoracic aorta without dissection. No pericardial effusion. Mediastinum/Nodes: Small scattered mediastinal and hilar lymph nodes, typically reactive. No thyroid nodule. Patulous distal esophagus. Lungs/Pleura: Extensive bilateral lung opacities, primarily ground-glass and nodular. There is a upper lobe predominant and right greater than left lung predominance. No significant pleural effusion. Upper Abdomen: No acute or unexpected findings. Musculoskeletal: There are no acute or suspicious osseous abnormalities. No chest wall soft tissue abnormalities. Review of the MIP images confirms the above findings. IMPRESSION: 1. No pulmonary embolus. 2. Extensive bilateral lung opacities, primarily ground-glass and nodular. Differential considerations include multifocal pneumonia or pulmonary edema. 3. Upper normal heart size. Electronically Signed   By: MKeith Rake  M.D.   On: 04/24/2022 23:42   DG Chest Portable 1 View  Result Date: 04/24/2022 CLINICAL DATA:  Hypoxia. EXAM: PORTABLE CHEST 1 VIEW COMPARISON:  05/12/2019. FINDINGS: The heart size and mediastinal contours are within normal limits. Hazy diffuse airspace disease is present in the lungs, greater on the right than on the left. No effusion or pneumothorax. No acute osseous abnormality. IMPRESSION: Diffuse hazy airspace disease in the lungs bilaterally, greater on the right than on the left, possible edema or pneumonia. Electronically Signed   By: Brett Fairy M.D.   On: 04/24/2022 20:27    DISCHARGE EXAMINATION: Vitals:   04/28/22 0455 04/28/22 0600 04/28/22 0800 04/28/22 0830  BP: 119/68  (!) 120/91 (!) 120/91  Pulse: (!) 57 61 64 67  Resp: _0 Temp: 98.6 F (37 C)     TempSrc: Oral     SpO2: 94% 96% 91% 93%   General appearance: Awake  alert.  In no distress Resp: Clear to auscultation bilaterally.  Normal effort Cardio: S1-S2 is normal regular.  No S3-S4.  No rubs murmurs or bruit GI: Abdomen is soft.  Nontender nondistended.  Bowel sounds are present normal.  No masses organomegaly Extremities: No edema.  Full range of motion of lower extremities. Neurologic: Alert and oriented x3.  No focal neurological deficits.    DISPOSITION: Home  Discharge Instructions     Call MD for:  difficulty breathing, headache or visual disturbances   Complete by: As directed    Call MD for:  extreme fatigue   Complete by: As directed    Call MD for:  persistant dizziness or light-headedness   Complete by: As directed    Call MD for:  persistant nausea and vomiting   Complete by: As directed    Call MD for:  severe uncontrolled pain   Complete by: As directed    Call MD for:  temperature >100.4   Complete by: As directed    Diet general   Complete by: As directed    Discharge instructions   Complete by: As directed    Please take your medications as prescribed.  A referral has been sent to cardiology office to arrange outpatient follow-up.  Please be sure to follow-up with your primary care provider within 1 week.  You were cared for by a hospitalist during your hospital stay. If you have any questions about your discharge medications or the care you received while you were in the hospital after you are discharged, you can call the unit and asked to speak with the hospitalist on call if the hospitalist that took care of you is not available. Once you are discharged, your primary care physician will handle any further medical issues. Please note that NO REFILLS for any discharge medications will be authorized once you are discharged, as it is imperative that you return to your primary care physician (or establish a relationship with a primary care physician if you do not have one) for your aftercare needs so that they can reassess your  need for medications and monitor your lab values. If you do not have a primary care physician, you can call [phone] for a physician referral.   Increase activity slowly   Complete by: As directed           Allergies as of 04/28/2022   No Known Allergies      Medication List     TAKE these medications    azithromycin 500 MG tablet  Commonly known as: ZITHROMAX Take 1 tablet (500 mg total) by mouth daily for 3 days. Start taking on: April 29, 2022   cefdinir 300 MG capsule Commonly known as: OMNICEF Take 1 capsule (300 mg total) by mouth every 12 (twelve) hours for 4 days.          Follow-up Information     Maury Dus, MD Follow up in 1 week(s).   Specialty: Family Medicine Why: post hospitalization follow up Contact information: [place] [address] [phone]         Floydene Flock, DO Follow up.   Specialty: Cardiology Why: office will call to schedule appontment. Contact information: [place] [address] [phone]                 TOTAL DISCHARGE TIME: 12 minutes  [facility] Pager on [email or web address]  04/28/2022, 12:22 PM

## 2022-04-28 NOTE — Progress Notes (Signed)
Pt ordered to discharge home. AVS reviewed with pt. Education provided as needed. Patient verbalized understanding. All questions answered.  

## 2022-04-28 NOTE — Progress Notes (Signed)
  Transition of Care [facility]) Screening Note   Patient Details  Name: Daniel [name] Date of Birth: 09-24-1995   [facility] CM/SW Contact:    Harriet Masson, RN Phone Number: 04/28/2022, 7:09 AM    Transition of Care Department (TOC) has reviewed patient and no TOC needs have been identified at this time. We will continue to monitor patient advancement through interdisciplinary progression rounds. If new patient transition needs arise, please place a TOC consult.

## 2022-04-28 NOTE — Plan of Care (Signed)

## 2022-04-30 LAB — CULTURE, BLOOD (ROUTINE X 2)
Culture: NO GROWTH
Culture: NO GROWTH
Special Requests: ADEQUATE
Special Requests: ADEQUATE

## 2022-05-02 ENCOUNTER — Ambulatory Visit: Payer: 59 | Admitting: Internal Medicine

## 2023-06-04 ENCOUNTER — Emergency Department (HOSPITAL_COMMUNITY)
Admission: EM | Admit: 2023-06-04 | Discharge: 2023-06-05 | Disposition: A | Discharge status: Home / Self Care | Payer: Self-pay | Attending: Emergency Medicine | Admitting: Emergency Medicine

## 2023-06-04 DIAGNOSIS — Z20822 Contact with and (suspected) exposure to covid-19: Secondary | ICD-10-CM | POA: Insufficient documentation

## 2023-06-04 DIAGNOSIS — J121 Respiratory syncytial virus pneumonia: Secondary | ICD-10-CM | POA: Insufficient documentation

## 2023-06-04 DIAGNOSIS — B338 Other specified viral diseases: Secondary | ICD-10-CM

## 2023-06-05 ENCOUNTER — Emergency Department (HOSPITAL_COMMUNITY): Payer: Self-pay

## 2023-06-05 LAB — RESP PANEL BY RT-PCR (RSV, FLU A&B, COVID)  RVPGX2
Influenza A by PCR: NEGATIVE
Influenza B by PCR: NEGATIVE
Resp Syncytial Virus by PCR: POSITIVE — AB
SARS Coronavirus 2 by RT PCR: NEGATIVE

## 2023-06-05 MED ORDER — PREDNISONE 20 MG PO TABS: 40.0000 mg | ORAL_TABLET | Freq: Every day | ORAL | 0 refills | Status: DC

## 2023-06-05 MED ORDER — PREDNISONE 20 MG PO TABS
60.0000 mg | ORAL_TABLET | Freq: Once | ORAL | Status: AC
  Administered 2023-06-05: 60 mg via ORAL
  Filled 2023-06-05: qty 3

## 2023-06-05 MED ORDER — ACETAMINOPHEN 325 MG PO TABS
650.0000 mg | ORAL_TABLET | Freq: Once | ORAL | Status: AC | PRN
  Administered 2023-06-05: 650 mg via ORAL
  Filled 2023-06-05: qty 2

## 2023-06-05 MED ORDER — IPRATROPIUM-ALBUTEROL 0.5-2.5 (3) MG/3ML IN SOLN
3.0000 mL | Freq: Once | RESPIRATORY_TRACT | Status: AC
  Administered 2023-06-05: 3 mL via RESPIRATORY_TRACT
  Filled 2023-06-05: qty 3

## 2023-06-05 MED ORDER — ALBUTEROL SULFATE HFA 108 (90 BASE) MCG/ACT IN AERS: 1.0000 | INHALATION_SPRAY | Freq: Four times a day (QID) | RESPIRATORY_TRACT | 0 refills | Status: AC | PRN

## 2023-06-05 NOTE — ED Provider Notes (Signed)
 [place] EMERGENCY DEPARTMENT AT [facility] Provider Note   CSN: [id number] Arrival date & time: 06/04/23  2313     History  Chief Complaint  Patient presents with   Cough    Daniel [name] is a 28 y.o. male who presents to the ED for evaluation of shortness of breath, cough, URI symptoms.  Reports that for the last 4 to 5 days he has had ongoing symptoms to include fever, cough, shortness of breath, body aches and chills.  Denies any known sick contacts.  Denies medications prior to arrival.  Reports he was given Tylenol  in triage which did help his headache.  Also endorsing shortness of breath and cough for the last 4 days.  Denies lightheadedness or dizziness, chest pain.  Denies smoking, history of asthma.   Cough Associated symptoms: fever, headaches and shortness of breath   Associated symptoms: no chest pain        Home Medications Prior to Admission medications   Medication Sig Start Date End Date Taking? Authorizing Provider  albuterol  (VENTOLIN  HFA) 108 (90 Base) MCG/ACT inhaler Inhale 1-2 puffs into the lungs every 6 (six) hours as needed for wheezing or shortness of breath. 06/05/23  Yes Ruthell Lonni FALCON, PA-C  predniSONE  (DELTASONE ) 20 MG tablet Take 2 tablets (40 mg total) by mouth daily. 06/05/23  Yes Ruthell Lonni FALCON, PA-C      Allergies    Patient has no known allergies.    Review of Systems   Review of Systems  Constitutional:  Positive for fever.  Respiratory:  Positive for cough and shortness of breath.   Cardiovascular:  Negative for chest pain.  Neurological:  Positive for headaches. Negative for dizziness and light-headedness.  All other systems reviewed and are negative.   Physical Exam Updated Vital Signs BP (!) 151/101   Pulse 77   Temp 99 F (37.2 C) (Oral)   Resp 19   SpO2 96%  Physical Exam Vitals and nursing note reviewed.  Constitutional:      General: He is not in acute distress.    Appearance: He is  well-developed.  HENT:     Head: Normocephalic and atraumatic.  Eyes:     Conjunctiva/sclera: Conjunctivae normal.  Cardiovascular:     Rate and Rhythm: Normal rate and regular rhythm.     Heart sounds: No murmur heard. Pulmonary:     Effort: Pulmonary effort is normal. No respiratory distress.     Breath sounds: Wheezing present.  Abdominal:     Palpations: Abdomen is soft.     Tenderness: There is no abdominal tenderness.  Musculoskeletal:        General: No swelling.     Cervical back: Neck supple.     Right lower leg: No edema.     Left lower leg: No edema.  Skin:    General: Skin is warm and dry.     Capillary Refill: Capillary refill takes less than 2 seconds.  Neurological:     Mental Status: He is alert.  Psychiatric:        Mood and Affect: Mood normal.     ED Results / Procedures / Treatments   Labs (all labs ordered are listed, but only abnormal results are displayed) Labs Reviewed  RESP PANEL BY RT-PCR (RSV, FLU A&B, COVID)  RVPGX2 - Abnormal; Notable for the following components:      Result Value   Resp Syncytial Virus by PCR POSITIVE (*)    All other components  within normal limits    EKG None  Radiology DG Chest 2 View Result Date: 06/05/2023 CLINICAL DATA:  Short no soft breath.  Cough. EXAM: CHEST - 2 VIEW COMPARISON:  04/27/2022 FINDINGS: The cardiomediastinal contours are normal. Mild patchy retrocardiac opacity. Pulmonary vasculature is normal. No pleural effusion or pneumothorax. No acute osseous abnormalities are seen. IMPRESSION: Mild patchy retrocardiac opacity, atelectasis versus pneumonia. Electronically Signed   By: Andrea Gasman M.D.   On: 06/05/2023 00:52    Procedures Procedures   Medications Ordered in ED Medications  acetaminophen  (TYLENOL ) tablet 650 mg (650 mg Oral Given 06/05/23 0033)  ipratropium-albuterol  (DUONEB) 0.5-2.5 (3) MG/3ML nebulizer solution 3 mL (3 mLs Nebulization Given 06/05/23 0503)  predniSONE  (DELTASONE ) tablet  60 mg (60 mg Oral Given 06/05/23 0502)    ED Course/ Medical Decision Making/ A&P  Medical Decision Making Amount and/or Complexity of Data Reviewed Radiology: ordered.  Risk OTC drugs. Prescription drug management.   28 year old male presents for evaluation.  Please see HPI for further details.  On examination patient is afebrile and nontachycardic.  His lung sounds have wheezing, not hypoxic on room air.  Abdomen is soft and compressible.  Neurological examination is at baseline.  Overall nontoxic in appearance.  Viral panel positive for RSV.  Chest x-ray shows possible viral pneumonia.  Patient did have wheezing on exam so provided DuoNeb and steroids.  Will send patient home with albuterol  inhaler, few days of steroids.  Will have him treat symptoms conservatively at home.  Have discussed this with the patient and he voiced understanding.  Stable to discharge home.   Final Clinical Impression(s) / ED Diagnoses Final diagnoses:  RSV (respiratory syncytial virus infection)  RSV (respiratory syncytial virus pneumonia)    Rx / DC Orders ED Discharge Orders          Ordered    albuterol  (VENTOLIN  HFA) 108 (90 Base) MCG/ACT inhaler  Every 6 hours PRN        06/05/23 0518    predniSONE  (DELTASONE ) 20 MG tablet  Daily        06/05/23 0518              Ruthell Lonni FALCON, PA-C 06/05/23 0518    Midge Golas, MD 06/05/23 [phone]

## 2023-06-05 NOTE — Discharge Instructions (Signed)
 It was a pleasure taking part in your care.  Please continue to use albuterol  inhaler every 4 hours as needed.  Please continue taking steroids once a day beginning on 1/4.  You will take 40 mg for the next 3 days.  Please treat symptoms at home conservatively.  You may purchase Zicam which will help shorten duration of symptoms.  Take Tylenol  or ibuprofen  at home for body aches and chills, fever.

## 2023-06-05 NOTE — ED Triage Notes (Signed)
 Patient arrived with complaints of a productive cough, headache and some shortness of breath over the last few days.

## 2023-08-26 ENCOUNTER — Encounter (HOSPITAL_COMMUNITY): Payer: Self-pay | Admitting: Emergency Medicine

## 2023-08-26 ENCOUNTER — Other Ambulatory Visit: Payer: Self-pay

## 2023-08-26 ENCOUNTER — Emergency Department (HOSPITAL_COMMUNITY)
Admission: EM | Admit: 2023-08-26 | Discharge: 2023-08-27 | Disposition: A | Discharge status: Home / Self Care | Payer: Self-pay | Attending: Emergency Medicine | Admitting: Emergency Medicine

## 2023-08-26 DIAGNOSIS — L299 Pruritus, unspecified: Secondary | ICD-10-CM | POA: Insufficient documentation

## 2023-08-26 DIAGNOSIS — X58XXXA Exposure to other specified factors, initial encounter: Secondary | ICD-10-CM | POA: Insufficient documentation

## 2023-08-26 DIAGNOSIS — K0889 Other specified disorders of teeth and supporting structures: Secondary | ICD-10-CM | POA: Insufficient documentation

## 2023-08-26 DIAGNOSIS — R000 Tachycardia, unspecified: Secondary | ICD-10-CM | POA: Insufficient documentation

## 2023-08-26 DIAGNOSIS — T50901A Poisoning by unspecified drugs, medicaments and biological substances, accidental (unintentional), initial encounter: Secondary | ICD-10-CM | POA: Insufficient documentation

## 2023-08-26 DIAGNOSIS — T6591XA Toxic effect of unspecified substance, accidental (unintentional), initial encounter: Secondary | ICD-10-CM

## 2023-08-26 MED ORDER — SODIUM CHLORIDE 0.9 % IV BOLUS
1000.0000 mL | Freq: Once | INTRAVENOUS | Status: AC
  Administered 2023-08-26: 1000 mL via INTRAVENOUS

## 2023-08-26 NOTE — ED Triage Notes (Signed)
 Pt in via [facility] after reported ingestion. EMS states pt's family called for possible overdose, pt reports taking "half a pill", unknown what substance. EMS arrived to find pt slumped over and wedged in between rails of wheelchair ramp. Arrives with GCS 13 VS enroute: 148/76 HR 134 99% Clear lungs CBG 152 Pupils 2 and reactive 20G LAC started in field

## 2023-08-27 ENCOUNTER — Emergency Department (HOSPITAL_COMMUNITY): Payer: Self-pay

## 2023-08-27 LAB — CBC WITH DIFFERENTIAL/PLATELET
Abs Immature Granulocytes: 0.04 10*3/uL (ref 0.00–0.07)
Basophils Absolute: 0.1 10*3/uL (ref 0.0–0.1)
Basophils Relative: 1 %
Eosinophils Absolute: 0.3 10*3/uL (ref 0.0–0.5)
Eosinophils Relative: 2 %
HCT: 49.6 % (ref 39.0–52.0)
Hemoglobin: 16.3 g/dL (ref 13.0–17.0)
Immature Granulocytes: 0 %
Lymphocytes Relative: 28 %
Lymphs Abs: 4.3 10*3/uL — ABNORMAL HIGH (ref 0.7–4.0)
MCH: 30 pg (ref 26.0–34.0)
MCHC: 32.9 g/dL (ref 30.0–36.0)
MCV: 91.3 fL (ref 80.0–100.0)
Monocytes Absolute: 1.2 10*3/uL — ABNORMAL HIGH (ref 0.1–1.0)
Monocytes Relative: 8 %
Neutro Abs: 9.3 10*3/uL — ABNORMAL HIGH (ref 1.7–7.7)
Neutrophils Relative %: 61 %
Platelets: 368 10*3/uL (ref 150–400)
RBC: 5.43 MIL/uL (ref 4.22–5.81)
RDW: 13.1 % (ref 11.5–15.5)
WBC: 15 10*3/uL — ABNORMAL HIGH (ref 4.0–10.5)
nRBC: 0 % (ref 0.0–0.2)

## 2023-08-27 LAB — RAPID URINE DRUG SCREEN, HOSP PERFORMED
Amphetamines: NOT DETECTED
Barbiturates: NOT DETECTED
Benzodiazepines: NOT DETECTED
Cocaine: NOT DETECTED
Opiates: NOT DETECTED
Tetrahydrocannabinol: NOT DETECTED

## 2023-08-27 LAB — COMPREHENSIVE METABOLIC PANEL WITH GFR
ALT: 17 U/L (ref 0–44)
AST: 25 U/L (ref 15–41)
Albumin: 3.8 g/dL (ref 3.5–5.0)
Alkaline Phosphatase: 80 U/L (ref 38–126)
Anion gap: 10 (ref 5–15)
BUN: 12 mg/dL (ref 6–20)
CO2: 27 mmol/L (ref 22–32)
Calcium: 8.7 mg/dL — ABNORMAL LOW (ref 8.9–10.3)
Chloride: 104 mmol/L (ref 98–111)
Creatinine, Ser: 1.12 mg/dL (ref 0.61–1.24)
GFR, Estimated: >60 mL/min (ref >60)
Glucose, Bld: 126 mg/dL — ABNORMAL HIGH (ref 70–99)
Potassium: 4 mmol/L (ref 3.5–5.1)
Sodium: 141 mmol/L (ref 135–145)
Total Bilirubin: 0.6 mg/dL (ref 0.0–1.2)
Total Protein: 7 g/dL (ref 6.5–8.1)

## 2023-08-27 LAB — SALICYLATE LEVEL: Salicylate Lvl: <7 mg/dL — ABNORMAL LOW (ref 7.0–30.0)

## 2023-08-27 LAB — ETHANOL: Alcohol, Ethyl (B): <10 mg/dL (ref <10)

## 2023-08-27 LAB — ACETAMINOPHEN LEVEL: Acetaminophen (Tylenol), Serum: <10 ug/mL — ABNORMAL LOW (ref 10–30)

## 2023-08-27 MED ORDER — DIPHENHYDRAMINE HCL 50 MG/ML IJ SOLN
25.0000 mg | Freq: Once | INTRAMUSCULAR | Status: AC
  Administered 2023-08-27: 25 mg via INTRAVENOUS
  Filled 2023-08-27: qty 1

## 2023-08-27 MED ORDER — ACETAMINOPHEN 325 MG PO TABS
650.0000 mg | ORAL_TABLET | Freq: Once | ORAL | Status: AC
  Administered 2023-08-27: 650 mg via ORAL
  Filled 2023-08-27: qty 2

## 2023-08-27 NOTE — Discharge Instructions (Signed)
 Your presentation tonight was consistent with a possible substance ingestion.  Please do not take any substances purchased from illicit sources and only take medications prescribed by medical professionals.  If you develop any life-threatening symptoms please return to the emergency department.

## 2023-08-27 NOTE — ED Notes (Signed)
 Patient standing up in room stretching. Patient states he is feeling better.

## 2023-08-27 NOTE — ED Provider Notes (Signed)
 [place] EMERGENCY DEPARTMENT AT [facility] Provider Note   CSN: [id number] Arrival date & time: 08/26/23  2319     History  Chief Complaint  Patient presents with   Ingestion    Daniel [name] is a 28 y.o. male.  Patient presents to the emergency department via EMS after a possible ingestion of an unknown substance.  Patient's family called EMS due to patient taking a half a pill of an unknown substance.  Upon EMS arrival patient had accidentally wedged himself in between the rails of a wheelchair ramp.  He was alert and oriented to person, event, and time.  Patient states he had a toothache and took a pill for his toothache from the "pill man" and is unsure as to what the medication was.  He stated it may have been oxycodone, hydrocodone, possibly something laced with fentanyl.  He complains of pruritus but denies other complaints at this time.  Past medical history significant for ADHD, history of AKI and acute respiratory failure with hypoxia   Ingestion       Home Medications Prior to Admission medications   Medication Sig Start Date End Date Taking? Authorizing Provider  albuterol (VENTOLIN HFA) 108 (90 Base) MCG/ACT inhaler Inhale 1-2 puffs into the lungs every 6 (six) hours as needed for wheezing or shortness of breath. 06/05/23   Al Decant, PA-C  predniSONE (DELTASONE) 20 MG tablet Take 2 tablets (40 mg total) by mouth daily. 06/05/23   Al Decant, PA-C      Allergies    Patient has no known allergies.    Review of Systems   Review of Systems  Physical Exam Updated Vital Signs BP 129/84   Pulse 95   Temp 98.3 F (36.8 C) (Oral)   Resp 19   Wt 106.6 kg   SpO2 100%   BMI 30.17 kg/m  Physical Exam Vitals and nursing note reviewed.  Constitutional:      General: He is not in acute distress.    Appearance: He is well-developed.  HENT:     Head: Normocephalic and atraumatic.  Eyes:     Conjunctiva/sclera:  Conjunctivae normal.     Pupils: Pupils are equal, round, and reactive to light.  Cardiovascular:     Rate and Rhythm: Regular rhythm. Tachycardia present.  Pulmonary:     Effort: Pulmonary effort is normal. No respiratory distress.     Breath sounds: Normal breath sounds.  Abdominal:     Palpations: Abdomen is soft.     Tenderness: There is no abdominal tenderness.  Musculoskeletal:        General: No swelling.     Cervical back: Neck supple.  Skin:    General: Skin is warm and dry.     Capillary Refill: Capillary refill takes less than 2 seconds.  Neurological:     Mental Status: He is alert.  Psychiatric:     Comments: Patient initially with euphoric mood     ED Results / Procedures / Treatments   Labs (all labs ordered are listed, but only abnormal results are displayed) Labs Reviewed  COMPREHENSIVE METABOLIC PANEL - Abnormal; Notable for the following components:      Result Value   Glucose, Bld 126 (*)    Calcium 8.7 (*)    All other components within normal limits  CBC WITH DIFFERENTIAL/PLATELET - Abnormal; Notable for the following components:   WBC 15.0 (*)    Neutro Abs 9.3 (*)    Lymphs  Abs 4.3 (*)    Monocytes Absolute 1.2 (*)    All other components within normal limits  SALICYLATE LEVEL - Abnormal; Notable for the following components:   Salicylate Lvl <7.0 (*)    All other components within normal limits  ACETAMINOPHEN LEVEL - Abnormal; Notable for the following components:   Acetaminophen (Tylenol), Serum <10 (*)    All other components within normal limits  RAPID URINE DRUG SCREEN, HOSP PERFORMED  ETHANOL    EKG None  Radiology DG Chest Portable 1 View Result Date: 08/27/2023 CLINICAL DATA:  Chest soreness/post trauma EXAM: PORTABLE CHEST 1 VIEW COMPARISON:  CHEST X-RAY 06/05/2023 FINDINGS: The heart and mediastinal contours are within normal limits. Low lung volumes. No focal consolidation. No pulmonary edema. No pleural effusion. No  pneumothorax. No acute osseous abnormality. IMPRESSION: Low lung volumes with no active disease. Electronically Signed   By: Tish Frederickson M.D.   On: 08/27/2023 00:41    Procedures Procedures    Medications Ordered in ED Medications  sodium chloride 0.9 % bolus 1,000 mL (0 mLs Intravenous Stopped 08/27/23 0051)  diphenhydrAMINE (BENADRYL) injection 25 mg (25 mg Intravenous Given 08/27/23 0032)  acetaminophen (TYLENOL) tablet 650 mg (650 mg Oral Given 08/27/23 0607)    ED Course/ Medical Decision Making/ A&P                                 Medical Decision Making Amount and/or Complexity of Data Reviewed Labs: ordered. Radiology: ordered.  Risk OTC drugs. Prescription drug management.   This patient presents to the ED for concern of drug ingestion, this involves an extensive number of treatment options, and is a complaint that carries with it a high risk of complications and morbidity.  The differential diagnosis includes overdose, kidney injury, other organ dysfunction, others   Co morbidities that complicate the patient evaluation  History of opiate overdose, ADHD   Additional history obtained:  Additional history obtained from EMS  Lab Tests:  I Ordered, and personally interpreted labs.  The pertinent results include: Grossly unremarkable CMP, CBC, UDS, ethanol   Imaging Studies ordered:  I ordered imaging studies including chest x-ray I independently visualized and interpreted imaging which showed no active disease I agree with the radiologist interpretation   Cardiac Monitoring: / EKG:  The patient was maintained on a cardiac monitor.  I personally viewed and interpreted the cardiac monitored which showed an underlying rhythm of: Sinus tachycardia   Problem List / ED Course / Critical interventions / Medication management   I ordered medication including normal saline for fluid resuscitation, Benadryl for pruritus Reevaluation of the patient after these  medicines showed that the patient improved I have reviewed the patients home medicines and have made adjustments as needed   Social Determinants of Health:  Patient reports daily tobacco usage   Test / Admission - Considered:  Patient presents with altered mental status subsequent to unknown substance ingestion.  Patient does not endorse knowing what substance he took.  UDS was negative.  Patient was monitored in the ED and allowed to metabolize and has returned to baseline.  Plan to discharge home at this time.  Return precautions provided.         Final Clinical Impression(s) / ED Diagnoses Final diagnoses:  Accidental ingestion of substance, initial encounter    Rx / DC Orders ED Discharge Orders     None  Pamala Duffel 08/27/23 1308    Tilden Fossa, MD 08/27/23 [phone]

## 2023-10-08 ENCOUNTER — Encounter (HOSPITAL_COMMUNITY): Payer: Self-pay | Admitting: *Deleted

## 2023-10-08 ENCOUNTER — Other Ambulatory Visit: Payer: Self-pay

## 2023-10-08 ENCOUNTER — Emergency Department (HOSPITAL_COMMUNITY)
Admission: EM | Admit: 2023-10-08 | Discharge: 2023-10-09 | Disposition: A | Discharge status: Home / Self Care | Payer: Self-pay | Attending: Emergency Medicine | Admitting: Emergency Medicine

## 2023-10-08 DIAGNOSIS — R112 Nausea with vomiting, unspecified: Secondary | ICD-10-CM | POA: Insufficient documentation

## 2023-10-08 DIAGNOSIS — R197 Diarrhea, unspecified: Secondary | ICD-10-CM | POA: Insufficient documentation

## 2023-10-08 DIAGNOSIS — D72829 Elevated white blood cell count, unspecified: Secondary | ICD-10-CM | POA: Insufficient documentation

## 2023-10-08 DIAGNOSIS — R739 Hyperglycemia, unspecified: Secondary | ICD-10-CM | POA: Insufficient documentation

## 2023-10-08 MED ORDER — ONDANSETRON 4 MG PO TBDP: 4.0000 mg | ORAL_TABLET | Freq: Once | ORAL | Status: DC

## 2023-10-08 NOTE — ED Provider Triage Note (Signed)
 Emergency Medicine Provider Triage Evaluation Note  Daniel [name] , a 28 y.o. male  was evaluated in triage.  Pt complains of NV drowsy headache, HA right sides comes and goes.  Review of Systems  Positive: HA, N Negative: Fever  Physical Exam  BP (!) 151/108 (BP Location: Left Arm)   Pulse (!) 47   Temp 98.3 F (36.8 C) (Oral)   Resp 15   Ht 6\' 2"  (1.88 m)   SpO2 99%   BMI 30.17 kg/m  Gen:   Awake, no distress   Resp:  Normal effort  MSK:   Moves extremities without difficulty  Other:    Medical Decision Making  Medically screening exam initiated at 11:57 PM.  Appropriate orders placed.  Daniel [name] was informed that the remainder of the evaluation will be completed by another provider, this initial triage assessment does not replace that evaluation, and the importance of remaining in the ED until their evaluation is complete.     Eudora Heron, PA-C 10/08/23 2358

## 2023-10-08 NOTE — ED Notes (Signed)
 ED Provider at bedside.

## 2023-10-08 NOTE — ED Triage Notes (Signed)
 Pt ambulatory to triage, says that he feels dehydrated because for 2 days he has had vomiting and diarrhea. Denies pain now, but sometimes has abdominal pain that comes and goes.

## 2023-10-09 ENCOUNTER — Emergency Department (HOSPITAL_COMMUNITY): Payer: Self-pay

## 2023-10-09 LAB — COMPREHENSIVE METABOLIC PANEL WITH GFR
ALT: 11 U/L (ref 0–44)
AST: 21 U/L (ref 15–41)
Albumin: 4.6 g/dL (ref 3.5–5.0)
Alkaline Phosphatase: 81 U/L (ref 38–126)
Anion gap: 9 (ref 5–15)
BUN: 11 mg/dL (ref 6–20)
CO2: 26 mmol/L (ref 22–32)
Calcium: 9.4 mg/dL (ref 8.9–10.3)
Chloride: 100 mmol/L (ref 98–111)
Creatinine, Ser: 1.02 mg/dL (ref 0.61–1.24)
GFR, Estimated: >60 mL/min (ref >60)
Glucose, Bld: 132 mg/dL — ABNORMAL HIGH (ref 70–99)
Potassium: 4.5 mmol/L (ref 3.5–5.1)
Sodium: 135 mmol/L (ref 135–145)
Total Bilirubin: 1 mg/dL (ref 0.0–1.2)
Total Protein: 8.4 g/dL — ABNORMAL HIGH (ref 6.5–8.1)

## 2023-10-09 LAB — CBC
HCT: 50.5 % (ref 39.0–52.0)
Hemoglobin: 16.7 g/dL (ref 13.0–17.0)
MCH: 30.2 pg (ref 26.0–34.0)
MCHC: 33.1 g/dL (ref 30.0–36.0)
MCV: 91.3 fL (ref 80.0–100.0)
Platelets: 342 10*3/uL (ref 150–400)
RBC: 5.53 MIL/uL (ref 4.22–5.81)
RDW: 12.6 % (ref 11.5–15.5)
WBC: 15.6 10*3/uL — ABNORMAL HIGH (ref 4.0–10.5)
nRBC: 0 % (ref 0.0–0.2)

## 2023-10-09 LAB — URINALYSIS, ROUTINE W REFLEX MICROSCOPIC
Bilirubin Urine: NEGATIVE
Glucose, UA: NEGATIVE mg/dL
Hgb urine dipstick: NEGATIVE
Ketones, ur: NEGATIVE mg/dL
Leukocytes,Ua: NEGATIVE
Nitrite: NEGATIVE
Protein, ur: NEGATIVE mg/dL
Specific Gravity, Urine: 1.026 (ref 1.005–1.030)
pH: 6 (ref 5.0–8.0)

## 2023-10-09 LAB — RAPID URINE DRUG SCREEN, HOSP PERFORMED
Amphetamines: NOT DETECTED
Barbiturates: NOT DETECTED
Benzodiazepines: NOT DETECTED
Cocaine: NOT DETECTED
Opiates: NOT DETECTED
Tetrahydrocannabinol: NOT DETECTED

## 2023-10-09 LAB — LIPASE, BLOOD: Lipase: 25 U/L (ref 11–51)

## 2023-10-09 LAB — ETHANOL: Alcohol, Ethyl (B): <15 mg/dL (ref <15)

## 2023-10-09 MED ORDER — SODIUM CHLORIDE 0.9 % IV BOLUS
1000.0000 mL | Freq: Once | INTRAVENOUS | Status: AC
  Administered 2023-10-09: 1000 mL via INTRAVENOUS

## 2023-10-09 MED ORDER — ONDANSETRON 4 MG PO TBDP: 4.0000 mg | ORAL_TABLET | Freq: Three times a day (TID) | ORAL | 0 refills | Status: DC | PRN

## 2023-10-09 MED ORDER — ONDANSETRON HCL 4 MG/2ML IJ SOLN
4.0000 mg | Freq: Once | INTRAMUSCULAR | Status: AC
  Administered 2023-10-09: 4 mg via INTRAVENOUS
  Filled 2023-10-09: qty 2

## 2023-10-09 NOTE — ED Notes (Addendum)
 Pt feels well enough to eat a sandwich, Per MD, it is ok Sandwich delivered.

## 2023-10-09 NOTE — Discharge Instructions (Signed)
Take loperamide (Imodium A-D) as needed for diarrhea. ?

## 2023-10-09 NOTE — ED Provider Notes (Signed)
 [place] EMERGENCY DEPARTMENT AT [facility] Provider Note   CSN: [id number] Arrival date & time: 10/08/23  2234     History  Chief Complaint  Patient presents with   Emesis    Daniel [name] is a 28 y.o. male.  The history is provided by the patient.  Emesis He has history of attention deficit disorder and comes in with a 2-day history of nausea and vomiting.  He initially had diarrhea, but has not had diarrhea today.  He endorses some abdominal cramping.  He denies fever but has had chills and sweats.  He is also complaining of generalized headache which waxes and wanes.  He denies any sick contacts.   Home Medications Prior to Admission medications   Medication Sig Start Date End Date Taking? Authorizing Provider  ondansetron  (ZOFRAN -ODT) 4 MG disintegrating tablet Take 1 tablet (4 mg total) by mouth every 8 (eight) hours as needed for nausea or vomiting. 10/09/23  Yes Alissa April, MD  albuterol  (VENTOLIN  HFA) 108 (90 Base) MCG/ACT inhaler Inhale 1-2 puffs into the lungs every 6 (six) hours as needed for wheezing or shortness of breath. 06/05/23   Groce, Christopher F, PA-C      Allergies    Patient has no known allergies.    Review of Systems   Review of Systems  Gastrointestinal:  Positive for vomiting.  All other systems reviewed and are negative.   Physical Exam Updated Vital Signs BP 113/74   Pulse (!) 57   Temp (!) 97.5 F (36.4 C) (Oral)   Resp 16   Ht 6\' 2"  (1.88 m)   SpO2 100%   BMI 30.17 kg/m  Physical Exam Vitals and nursing note reviewed.   28 year old male, resting comfortably and in no acute distress. Vital signs are significant for elevated blood pressure and borderline slow heart rate. Oxygen saturation is 100%, which is normal. Head is normocephalic and atraumatic. PERRLA, EOMI. Oropharynx is clear. Lungs are clear without rales, wheezes, or rhonchi. Chest is nontender. Heart has regular rate and rhythm without  murmur. Abdomen is soft, flat, nontender. Extremities have no cyanosis or edema, full range of motion is present. Skin is warm and dry without rash. Neurologic: Mental status is normal, moves all extremities equally.  ED Results / Procedures / Treatments   Labs (all labs ordered are listed, but only abnormal results are displayed) Labs Reviewed  COMPREHENSIVE METABOLIC PANEL WITH GFR - Abnormal; Notable for the following components:      Result Value   Glucose, Bld 132 (*)    Total Protein 8.4 (*)    All other components within normal limits  CBC - Abnormal; Notable for the following components:   WBC 15.6 (*)    All other components within normal limits  LIPASE, BLOOD  URINALYSIS, ROUTINE W REFLEX MICROSCOPIC  RAPID URINE DRUG SCREEN, HOSP PERFORMED  ETHANOL    EKG None  Radiology CT Head Wo Contrast Result Date: 10/09/2023 CLINICAL DATA:  Headache, mental status changes. EXAM: CT HEAD WITHOUT CONTRAST TECHNIQUE: Contiguous axial images were obtained from the base of the skull through the vertex without intravenous contrast. RADIATION DOSE REDUCTION: This exam was performed according to the departmental dose-optimization program which includes automated exposure control, adjustment of the mA and/or kV according to patient size and/or use of iterative reconstruction technique. COMPARISON:  10/06/2019 FINDINGS: Brain: No acute intracranial abnormality. Specifically, no hemorrhage, hydrocephalus, mass lesion, acute infarction, or significant intracranial injury. Vascular: No hyperdense vessel or  unexpected calcification. Skull: No acute calvarial abnormality. Sinuses/Orbits: No acute findings Other: None IMPRESSION: Normal study. Electronically Signed   By: Janeece Mechanic M.D.   On: 10/09/2023 00:18    Procedures Procedures    Medications Ordered in ED Medications  ondansetron  (ZOFRAN -ODT) disintegrating tablet 4 mg (4 mg Oral Not Given 10/09/23 0226)  sodium chloride  0.9 % bolus  1,000 mL (1,000 mLs Intravenous New Bag/Given 10/09/23 0225)  ondansetron  (ZOFRAN ) injection 4 mg (4 mg Intravenous Given 10/09/23 0225)    ED Course/ Medical Decision Making/ A&P                                 Medical Decision Making Amount and/or Complexity of Data Reviewed Labs: ordered.  Risk Prescription drug management.   Nausea, vomiting, diarrhea and the pattern strongly suggestive of viral gastroenteritis.  Consider food poisoning.  Doubt serious pathology such as bowel obstruction, diverticulitis.  I have reviewed his laboratory test, my interpretation is elevated random glucose level which will need to be followed as an outpatient, mild leukocytosis which is nonspecific.  Urinalysis is normal including negative ketones.  Drug screen is negative for all tested drug classes.  CT of head shows no acute intracranial process.  Have independently viewed the images, and agree with the radiologist's interpretation.  I have ordered IV fluids and IV ondansetron .  Following above-noted treatment, patient states he was feeling better and was asking for a sandwich.  I am discharging him with a prescription for ondansetron  and I have advised him to use over-the-counter loperamide as needed if he has recurrence of diarrhea.  Final Clinical Impression(s) / ED Diagnoses Final diagnoses:  Nausea vomiting and diarrhea  Elevated random blood glucose level    Rx / DC Orders ED Discharge Orders          Ordered    ondansetron  (ZOFRAN -ODT) 4 MG disintegrating tablet  Every 8 hours PRN        10/09/23 0329              Alissa April, MD 10/09/23 0330

## 2023-12-28 ENCOUNTER — Emergency Department (HOSPITAL_COMMUNITY): Payer: Self-pay

## 2023-12-28 ENCOUNTER — Emergency Department (HOSPITAL_COMMUNITY)
Admission: EM | Admit: 2023-12-28 | Discharge: 2023-12-29 | Disposition: A | Discharge status: Home / Self Care | Payer: Self-pay | Attending: Emergency Medicine | Admitting: Emergency Medicine

## 2023-12-28 ENCOUNTER — Encounter (HOSPITAL_COMMUNITY): Payer: Self-pay

## 2023-12-28 ENCOUNTER — Other Ambulatory Visit: Payer: Self-pay

## 2023-12-28 DIAGNOSIS — Z23 Encounter for immunization: Secondary | ICD-10-CM | POA: Insufficient documentation

## 2023-12-28 DIAGNOSIS — S5001XA Contusion of right elbow, initial encounter: Secondary | ICD-10-CM | POA: Insufficient documentation

## 2023-12-28 DIAGNOSIS — S0101XA Laceration without foreign body of scalp, initial encounter: Secondary | ICD-10-CM | POA: Insufficient documentation

## 2023-12-28 DIAGNOSIS — D72829 Elevated white blood cell count, unspecified: Secondary | ICD-10-CM | POA: Insufficient documentation

## 2023-12-28 DIAGNOSIS — S80211A Abrasion, right knee, initial encounter: Secondary | ICD-10-CM | POA: Insufficient documentation

## 2023-12-28 DIAGNOSIS — S060X1A Concussion with loss of consciousness of 30 minutes or less, initial encounter: Secondary | ICD-10-CM | POA: Insufficient documentation

## 2023-12-28 LAB — BASIC METABOLIC PANEL WITH GFR
Anion gap: 10 (ref 5–15)
BUN: 9 mg/dL (ref 6–20)
CO2: 22 mmol/L (ref 22–32)
Calcium: 9.5 mg/dL (ref 8.9–10.3)
Chloride: 109 mmol/L (ref 98–111)
Creatinine, Ser: 1.21 mg/dL (ref 0.61–1.24)
GFR, Estimated: >60 mL/min (ref >60)
Glucose, Bld: 140 mg/dL — ABNORMAL HIGH (ref 70–99)
Potassium: 3.9 mmol/L (ref 3.5–5.1)
Sodium: 141 mmol/L (ref 135–145)

## 2023-12-28 LAB — CBC WITH DIFFERENTIAL/PLATELET
Band Neutrophils: 1 %
Basophils Absolute: 0 K/uL (ref 0.0–0.1)
Basophils Relative: 0 %
Eosinophils Absolute: 0 K/uL (ref 0.0–0.5)
Eosinophils Relative: 0 %
HCT: 50 % (ref 39.0–52.0)
Hemoglobin: 16.7 g/dL (ref 13.0–17.0)
Lymphocytes Relative: 9 %
Lymphs Abs: 2.4 K/uL (ref 0.7–4.0)
MCH: 31.2 pg (ref 26.0–34.0)
MCHC: 33.4 g/dL (ref 30.0–36.0)
MCV: 93.5 fL (ref 80.0–100.0)
Monocytes Absolute: 1.6 K/uL — ABNORMAL HIGH (ref 0.1–1.0)
Monocytes Relative: 7 %
Neutro Abs: 23 K/uL — ABNORMAL HIGH (ref 1.7–7.7)
Neutrophils Relative %: 84 %
Platelets: 372 K/uL (ref 150–400)
RBC: 5.35 MIL/uL (ref 4.22–5.81)
RDW: 13.2 % (ref 11.5–15.5)
Smear Review: NORMAL
WBC: 27 K/uL — ABNORMAL HIGH (ref 4.0–10.5)
nRBC: 0 % (ref 0.0–0.2)

## 2023-12-28 LAB — ETHANOL: Alcohol, Ethyl (B): <15 mg/dL (ref <15)

## 2023-12-28 MED ORDER — BACITRACIN ZINC 500 UNIT/GM EX OINT
TOPICAL_OINTMENT | Freq: Two times a day (BID) | CUTANEOUS | Status: DC
  Filled 2023-12-28: qty 1.8

## 2023-12-28 MED ORDER — KETOROLAC TROMETHAMINE 15 MG/ML IJ SOLN
15.0000 mg | Freq: Once | INTRAMUSCULAR | Status: AC
  Administered 2023-12-28: 15 mg via INTRAVENOUS
  Filled 2023-12-28: qty 1

## 2023-12-28 MED ORDER — CEFAZOLIN SODIUM-DEXTROSE 2-4 GM/100ML-% IV SOLN
2.0000 g | Freq: Once | INTRAVENOUS | Status: AC
  Administered 2023-12-29: 2 g via INTRAVENOUS
  Filled 2023-12-28: qty 100

## 2023-12-28 MED ORDER — TETANUS-DIPHTH-ACELL PERTUSSIS 5-2.5-18.5 LF-MCG/0.5 IM SUSY
0.5000 mL | PREFILLED_SYRINGE | Freq: Once | INTRAMUSCULAR | Status: AC
  Administered 2023-12-28: 0.5 mL via INTRAMUSCULAR
  Filled 2023-12-28: qty 0.5
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
## 2023-12-28 NOTE — ED Notes (Signed)
 Patient transported to CT
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
## 2023-12-28 NOTE — Discharge Instructions (Addendum)
 Keep wound clean with soap and water.  Watch for signs of infection.  Use Tylenol  every 4 hours and ibuprofen  every 6 hours needed for pain. Return for fevers, confusion or new concerns. Ice regularly. Call for appointment for recheck from ophthalmology. Have staples assessed and removed in approximately 10 days. No swimming pool use until wound healed.

## 2023-12-28 NOTE — ED Notes (Signed)
 Pt back from radiology

## 2023-12-29 NOTE — ED Notes (Signed)
Pt discharged. Pt given discharge papers and papers explained. Pt in NAD at this time

## 2023-12-29 NOTE — ED Provider Notes (Incomplete)
Check on patient.

## 2024-02-04 ENCOUNTER — Emergency Department (HOSPITAL_COMMUNITY)
Admission: EM | Admit: 2024-02-04 | Discharge: 2024-02-04 | Disposition: A | Discharge status: Home / Self Care | Payer: Self-pay | Attending: Emergency Medicine | Admitting: Emergency Medicine

## 2024-02-04 DIAGNOSIS — Z4802 Encounter for removal of sutures: Secondary | ICD-10-CM | POA: Diagnosis present

## 2024-02-04 MED ORDER — ACETAMINOPHEN 500 MG PO TABS
1000.0000 mg | ORAL_TABLET | Freq: Once | ORAL | Status: AC
  Administered 2024-02-04: 1000 mg via ORAL
  Filled 2024-02-04: qty 2

## 2024-02-04 NOTE — ED Triage Notes (Signed)
 Pt here from home needing the remaining staple in his head out

## 2024-02-04 NOTE — Discharge Instructions (Addendum)
 You had 3 staples removed today.  You stated the other staple came out when you are coming.  Return for any concerning symptoms.  No signs of infection today.  Establish a primary care doctor

## 2024-02-04 NOTE — ED Provider Notes (Addendum)
 [name] EM[facility] AT [facility] Provider Note   CSN: [id number] Arrival date & time: 02/04/24  1303     Patient presents with: Suture / Staple Removal and Headache   Daniel [name] is a 28 y.o. male.   28 year old male presents today for concern of staple removals.  He states 1 staple came out while he was combing.  He had these put in about 3 weeks ago.  No other complaints.  Agreeable to having these removed today.  The history is provided by the patient. No language interpreter was used.       Prior to Admission medications   Medication Sig Start Date End Date Taking? Authorizing Provider  albuterol  (VENTOLIN  HFA) 108 (90 Base) MCG/ACT inhaler Inhale 1-2 puffs into the lungs every 6 (six) hours as needed for wheezing or shortness of breath. 06/05/23   Ruthell Lonni FALCON, PA-C  ondansetron  (ZOFRAN -ODT) 4 MG disintegrating tablet Take 1 tablet (4 mg total) by mouth every 8 (eight) hours as needed for nausea or vomiting. 10/09/23   Raford Lenis, MD    Allergies: Patient has no known allergies.    Review of Systems  Constitutional:  Negative for chills and fever.  Skin:  Positive for wound.  All other systems reviewed and are negative.   Updated Vital Signs BP 121/63 (BP Location: Right Arm)   Pulse 74   Temp 98.5 F (36.9 C)   Resp 14   Ht 6' 1.5 (1.867 m)   Wt 99.8 kg   SpO2 96%   BMI 28.63 kg/m   Physical Exam Vitals and nursing note reviewed.  Constitutional:      General: He is not in acute distress.    Appearance: Normal appearance. He is not ill-appearing.  HENT:     Head: Normocephalic and atraumatic.     Comments: 3 staples on the left side of the scalp.  Wound well-healed and no signs of infection.    Nose: Nose normal.  Eyes:     Conjunctiva/sclera: Conjunctivae normal.  Cardiovascular:     Rate and Rhythm: Normal rate.  Pulmonary:     Effort: Pulmonary effort is normal. No respiratory distress.   Musculoskeletal:        General: No deformity.  Skin:    Findings: No rash.  Neurological:     Mental Status: He is alert.     (all labs ordered are listed, but only abnormal results are displayed) Labs Reviewed - No data to display  EKG: None  Radiology: No results found.   Suture Removal  Date/Time: 02/04/2024 1:26 PM  Performed by: Hildegard Loge, PA-C Authorized by: Hildegard Loge, PA-C   Consent:    Consent obtained:  Verbal   Consent given by:  Patient   Risks discussed:  Bleeding, pain and wound separation   Alternatives discussed:  No treatment and delayed treatment Universal protocol:    Procedure explained and questions answered to patient or proxy's satisfaction: yes     Relevant documents present and verified: yes     Patient identity confirmed:  Verbally with patient Location:    Location:  Head/neck   Head/neck location:  Scalp Procedure details:    Wound appearance:  No signs of infection and good wound healing   Number of staples removed:  3 Post-procedure details:    Procedure completion:  Tolerated well, no immediate complications    Medications Ordered in the ED  acetaminophen  (TYLENOL ) tablet 1,000 mg (has no administration in time  range)                                    Medical Decision Making Risk OTC drugs.   27 year old male presents today for staple removal.  No other concerns.  He states 1 staple removed while he was combing. Remainder of the staples removed today.  Tylenol  given for pain control.  Patient discharged in stable condition.  Return precautions discussed. Incision site well-healed.  No signs of infection.  Final diagnoses:  Encounter for staple removal    ED Discharge Orders     None          Hildegard Loge, PA-C 02/04/24 1325    [address] PA-C 02/04/24 1326    Yolande Lamar BROCKS, MD 02/10/24 [phone]

## 2024-04-12 ENCOUNTER — Other Ambulatory Visit: Payer: Self-pay

## 2024-04-12 ENCOUNTER — Emergency Department (HOSPITAL_COMMUNITY)

## 2024-04-12 ENCOUNTER — Emergency Department (HOSPITAL_COMMUNITY)
Admission: EM | Admit: 2024-04-12 | Discharge: 2024-04-12 | Disposition: A | Discharge status: Home / Self Care | Attending: Emergency Medicine | Admitting: Emergency Medicine

## 2024-04-12 DIAGNOSIS — X58XXXA Exposure to other specified factors, initial encounter: Secondary | ICD-10-CM | POA: Diagnosis not present

## 2024-04-12 DIAGNOSIS — F1910 Other psychoactive substance abuse, uncomplicated: Secondary | ICD-10-CM | POA: Diagnosis not present

## 2024-04-12 DIAGNOSIS — R799 Abnormal finding of blood chemistry, unspecified: Secondary | ICD-10-CM | POA: Insufficient documentation

## 2024-04-12 DIAGNOSIS — D72829 Elevated white blood cell count, unspecified: Secondary | ICD-10-CM | POA: Diagnosis not present

## 2024-04-12 DIAGNOSIS — J690 Pneumonitis due to inhalation of food and vomit: Secondary | ICD-10-CM | POA: Insufficient documentation

## 2024-04-12 DIAGNOSIS — T50901A Poisoning by unspecified drugs, medicaments and biological substances, accidental (unintentional), initial encounter: Secondary | ICD-10-CM | POA: Diagnosis present

## 2024-04-12 DIAGNOSIS — T402X1A Poisoning by other opioids, accidental (unintentional), initial encounter: Secondary | ICD-10-CM | POA: Insufficient documentation

## 2024-04-12 DIAGNOSIS — T40601A Poisoning by unspecified narcotics, accidental (unintentional), initial encounter: Secondary | ICD-10-CM

## 2024-04-12 LAB — URINALYSIS, ROUTINE W REFLEX MICROSCOPIC
Bilirubin Urine: NEGATIVE
Glucose, UA: NEGATIVE mg/dL
Hgb urine dipstick: NEGATIVE
Ketones, ur: NEGATIVE mg/dL
Leukocytes,Ua: NEGATIVE
Nitrite: NEGATIVE
Protein, ur: NEGATIVE mg/dL
Specific Gravity, Urine: 1.017 (ref 1.005–1.030)
pH: 6 (ref 5.0–8.0)

## 2024-04-12 LAB — CBC WITH DIFFERENTIAL/PLATELET
Abs Immature Granulocytes: 0.03 K/uL (ref 0.00–0.07)
Basophils Absolute: 0.1 K/uL (ref 0.0–0.1)
Basophils Relative: 0 %
Eosinophils Absolute: 0.3 K/uL (ref 0.0–0.5)
Eosinophils Relative: 2 %
HCT: 49.3 % (ref 39.0–52.0)
Hemoglobin: 16.4 g/dL (ref 13.0–17.0)
Immature Granulocytes: 0 %
Lymphocytes Relative: 24 %
Lymphs Abs: 3.4 K/uL (ref 0.7–4.0)
MCH: 30.7 pg (ref 26.0–34.0)
MCHC: 33.3 g/dL (ref 30.0–36.0)
MCV: 92.1 fL (ref 80.0–100.0)
Monocytes Absolute: 1.2 K/uL — ABNORMAL HIGH (ref 0.1–1.0)
Monocytes Relative: 8 %
Neutro Abs: 9.4 K/uL — ABNORMAL HIGH (ref 1.7–7.7)
Neutrophils Relative %: 66 %
Platelets: 301 K/uL (ref 150–400)
RBC: 5.35 MIL/uL (ref 4.22–5.81)
RDW: 13 % (ref 11.5–15.5)
WBC: 14.4 K/uL — ABNORMAL HIGH (ref 4.0–10.5)
nRBC: 0 % (ref 0.0–0.2)

## 2024-04-12 LAB — URINE DRUG SCREEN
Amphetamines: NEGATIVE
Barbiturates: NEGATIVE
Benzodiazepines: NEGATIVE
Cocaine: NEGATIVE
Fentanyl: POSITIVE — AB
Methadone Scn, Ur: NEGATIVE
Opiates: POSITIVE — AB
Tetrahydrocannabinol: NEGATIVE

## 2024-04-12 LAB — COMPREHENSIVE METABOLIC PANEL WITH GFR
ALT: 47 U/L — ABNORMAL HIGH (ref 0–44)
AST: 51 U/L — ABNORMAL HIGH (ref 15–41)
Albumin: 4.3 g/dL (ref 3.5–5.0)
Alkaline Phosphatase: 102 U/L (ref 38–126)
Anion gap: 9 (ref 5–15)
BUN: 12 mg/dL (ref 6–20)
CO2: 30 mmol/L (ref 22–32)
Calcium: 9.6 mg/dL (ref 8.9–10.3)
Chloride: 101 mmol/L (ref 98–111)
Creatinine, Ser: 0.92 mg/dL (ref 0.61–1.24)
GFR, Estimated: >60 mL/min (ref >60)
Glucose, Bld: 151 mg/dL — ABNORMAL HIGH (ref 70–99)
Potassium: 4.5 mmol/L (ref 3.5–5.1)
Sodium: 140 mmol/L (ref 135–145)
Total Bilirubin: 0.6 mg/dL (ref 0.0–1.2)
Total Protein: 7.4 g/dL (ref 6.5–8.1)

## 2024-04-12 LAB — ACETAMINOPHEN LEVEL: Acetaminophen (Tylenol), Serum: <10 ug/mL — ABNORMAL LOW (ref 10–30)

## 2024-04-12 LAB — ETHANOL: Alcohol, Ethyl (B): <15 mg/dL (ref <15)

## 2024-04-12 LAB — CBG MONITORING, ED: Glucose-Capillary: 181 mg/dL — ABNORMAL HIGH (ref 70–99)

## 2024-04-12 LAB — SALICYLATE LEVEL: Salicylate Lvl: <7 mg/dL — ABNORMAL LOW (ref 7.0–30.0)

## 2024-04-12 MED ORDER — SODIUM CHLORIDE 0.9 % IV BOLUS
1000.0000 mL | Freq: Once | INTRAVENOUS | Status: AC
  Administered 2024-04-12: 1000 mL via INTRAVENOUS

## 2024-04-12 MED ORDER — SODIUM CHLORIDE 0.9 % IV SOLN
3.0000 g | Freq: Once | INTRAVENOUS | Status: DC
Start: 2024-04-12 — End: 2024-04-12

## 2024-04-12 MED ORDER — AMOXICILLIN-POT CLAVULANATE 875-125 MG PO TABS: 1.0000 | ORAL_TABLET | Freq: Two times a day (BID) | ORAL | 0 refills | Status: AC

## 2024-04-12 MED ORDER — AMOXICILLIN-POT CLAVULANATE 875-125 MG PO TABS: 1.0000 | ORAL_TABLET | Freq: Two times a day (BID) | ORAL | 0 refills | Status: DC

## 2024-04-12 MED ORDER — NALOXONE HCL 4 MG/0.1ML NA LIQD: NASAL | 0 refills | Status: AC

## 2024-04-12 NOTE — ED Notes (Signed)
 Pt laying down but getting antsy; PT sister at bedside

## 2024-04-12 NOTE — ED Notes (Signed)
PT resting comfortably in bed.

## 2024-04-12 NOTE — ED Notes (Signed)
 PT laying in bed constantly asking about his ride and wanting to get up out of the bed.

## 2024-04-12 NOTE — ED Provider Notes (Signed)
 [place] EMERGENCY DEPARTMENT AT [facility] Provider Note   CSN: [id number] Arrival date & time: 04/12/24  1614     Patient presents with: Drug overdose  Daniel [name] is a 28 y.o. male who is brought in by EMS to the ED today for altered LOC after unspecified narcotic ingestion.  He states that he was at the white tablet, believes it may have been fentanyl.  Also admits to taking K2 prior to his period of unconsciousness.  Was found unresponsive by fire department and was reportedly in agonal respirations at the time, was given intranasal Narcan  which improved his level of consciousness and respiratory function.  Upon presenting to the ED he is awake but obviously lethargic and confused.  Only noted previous medical history is of ADHD.  Previous episode noted in 27 August 2023, similar description of nondescript pill that he obtained from pill man at the time.   HPI     Prior to Admission medications   Medication Sig Start Date End Date Taking? Authorizing Provider  naloxone  (NARCAN ) nasal spray 4 mg/0.1 mL To be used as needed for opioid overdose.  To be administered by nose. 04/12/24  Yes Myriam Dorn BROCKS, PA  albuterol  (VENTOLIN  HFA) 108 (90 Base) MCG/ACT inhaler Inhale 1-2 puffs into the lungs every 6 (six) hours as needed for wheezing or shortness of breath. 06/05/23   Ruthell Lonni FALCON, PA-C  amoxicillin -clavulanate (AUGMENTIN ) 875-125 MG tablet Take 1 tablet by mouth every 12 (twelve) hours. 04/12/24   Myriam Dorn BROCKS, PA  ondansetron  (ZOFRAN -ODT) 4 MG disintegrating tablet Take 1 tablet (4 mg total) by mouth every 8 (eight) hours as needed for nausea or vomiting. 10/09/23   Raford Lenis, MD    Allergies: Patient has no known allergies.    Review of Systems  Constitutional:  Positive for activity change.  Neurological:  Positive for weakness.  All other systems reviewed and are negative.   Updated Vital Signs BP (!) 154/102   Pulse 72    Temp 97.9 F (36.6 C) (Oral)   Resp 18   SpO2 100%   Physical Exam Vitals and nursing note reviewed.  Constitutional:      General: He is awake. He is not in acute distress.    Appearance: Normal appearance.     Comments: Lethargic, answering appropriately however often mumbled responses, airways patent and breathing appropriately.  HENT:     Head: Normocephalic and atraumatic.     Mouth/Throat:     Mouth: Mucous membranes are moist.     Pharynx: Oropharynx is clear. Uvula midline.     Comments: Airway patent Eyes:     General: Lids are normal. Vision grossly intact. Gaze aligned appropriately.     Extraocular Movements: Extraocular movements intact.     Conjunctiva/sclera: Conjunctivae normal.     Pupils: Pupils are equal, round, and reactive to light.  Neck:     Vascular: No JVD.  Cardiovascular:     Rate and Rhythm: Normal rate and regular rhythm.     Pulses: Normal pulses.     Heart sounds: Normal heart sounds, S1 normal and S2 normal. No murmur heard.    No friction rub. No gallop.  Pulmonary:     Effort: Pulmonary effort is normal.     Breath sounds: Normal breath sounds and air entry.  Abdominal:     General: Abdomen is flat. Bowel sounds are normal.     Palpations: Abdomen is soft.     Tenderness:  There is no abdominal tenderness.  Musculoskeletal:        General: Normal range of motion.     Cervical back: Full passive range of motion without pain, normal range of motion and neck supple.     Right lower leg: No edema.     Left lower leg: No edema.  Skin:    General: Skin is warm and dry.     Capillary Refill: Capillary refill takes less than 2 seconds.  Neurological:     General: No focal deficit present.     Mental Status: Mental status is at baseline. He is lethargic and confused.     GCS: GCS eye subscore is 4. GCS verbal subscore is 4. GCS motor subscore is 6.     (all labs ordered are listed, but only abnormal results are displayed) Labs Reviewed  CBC  WITH DIFFERENTIAL/PLATELET - Abnormal; Notable for the following components:      Result Value   WBC 14.4 (*)    Neutro Abs 9.4 (*)    Monocytes Absolute 1.2 (*)    All other components within normal limits  COMPREHENSIVE METABOLIC PANEL WITH GFR - Abnormal; Notable for the following components:   Glucose, Bld 151 (*)    AST 51 (*)    ALT 47 (*)    All other components within normal limits  URINE DRUG SCREEN - Abnormal; Notable for the following components:   Opiates POSITIVE (*)    Fentanyl POSITIVE (*)    All other components within normal limits  SALICYLATE LEVEL - Abnormal; Notable for the following components:   Salicylate Lvl <7.0 (*)    All other components within normal limits  ACETAMINOPHEN  LEVEL - Abnormal; Notable for the following components:   Acetaminophen  (Tylenol ), Serum <10 (*)    All other components within normal limits  CBG MONITORING, ED - Abnormal; Notable for the following components:   Glucose-Capillary 181 (*)    All other components within normal limits  URINALYSIS, ROUTINE W REFLEX MICROSCOPIC  ETHANOL    EKG: EKG Interpretation Date/Time:  Tuesday April 12 2024 16:42:02 EST Ventricular Rate:  53 PR Interval:  163 QRS Duration:  70 QT Interval:  384 QTC Calculation: 361 R Axis:   44  Text Interpretation: Sinus arrhythmia Low voltage, precordial leads Borderline T abnormalities, inferior leads No significant change since last tracing Confirmed by Patt Alm DEL ([id number]) on 04/12/2024 4:46:40 PM  Radiology: [facility] Chest Port 1 View Result Date: 04/12/2024 EXAM: 1 VIEW(S) XRAY OF THE CHEST 04/12/2024 05:37:00 PM COMPARISON: Comparison 08/27/2023. CLINICAL HISTORY: AMS. FINDINGS: LUNGS AND PLEURA: Mild right basilar opacities noted concerning for pneumonia. No pulmonary edema. No pleural effusion. No pneumothorax. HEART AND MEDIASTINUM: No acute abnormality of the cardiac and mediastinal silhouettes. BONES AND SOFT TISSUES: No acute osseous abnormality.  IMPRESSION: 1. Right basilar pneumonia. Electronically signed by: Lynwood Seip MD 04/12/2024 05:51 PM EST RP Workstation: [id number]     Procedures   Medications Ordered in the ED  sodium chloride  0.9 % bolus 1,000 mL (1,000 mLs Intravenous New Bag/Given 04/12/24 1728)                                    Medical Decision Making Amount and/or Complexity of Data Reviewed Labs: ordered. Radiology: ordered.  Risk Prescription drug management.   Medical Decision Making:   Daniel [name] is a 28 y.o. male who presented to the ED  today with opioid overdose detailed above.    External chart has been reviewed including previous admission records. Complete initial physical exam performed, notably the patient  was awake though lethargic and confused; though with normal respirations..    Reviewed and confirmed nursing documentation for past medical history, family history, social history.    Initial Assessment:   With the patient's presentation of altered level of consciousness, most likely diagnosis is opioid overdose.  Further consider polysubstance abuse given the endorsed history of K2 use.   Initial Plan:  Obtain serum ethanol as well as acetaminophen  and salicylates.  Evaluate potential polysubstance overdose. Screening labs including CBC and Metabolic panel to evaluate for infectious or metabolic etiology of disease.  Urinalysis with reflex culture ordered to evaluate for UTI or relevant urologic/nephrologic pathology.  Include UDS. Obtain acetaminophen  and salicylate levels to evaluate for polysubstance overdose. CXR to evaluate for structural/infectious intrathoracic pathology.  EKG to evaluate for cardiac pathology Objective evaluation as below reviewed   Initial Study Results:   Laboratory  All laboratory results reviewed without evidence of clinically relevant pathology.   Exceptions include: UDS positive for opiates and fentanyl.  AST and ALT are elevated, mild  leukocytosis of 14.4.  EKG EKG was reviewed independently. Rate, rhythm, axis, intervals all examined and without medically relevant abnormality. ST segments without concerns for elevations.    Radiology:  All images reviewed independently. Agree with radiology report at this time.   DG Chest Port 1 View Result Date: 04/12/2024 EXAM: 1 VIEW(S) XRAY OF THE CHEST 04/12/2024 05:37:00 PM COMPARISON: Comparison 08/27/2023. CLINICAL HISTORY: AMS. FINDINGS: LUNGS AND PLEURA: Mild right basilar opacities noted concerning for pneumonia. No pulmonary edema. No pleural effusion. No pneumothorax. HEART AND MEDIASTINUM: No acute abnormality of the cardiac and mediastinal silhouettes. BONES AND SOFT TISSUES: No acute osseous abnormality. IMPRESSION: 1. Right basilar pneumonia. Electronically signed by: Lynwood Seip MD 04/12/2024 05:51 PM EST RP Workstation: [id number]     Reassessment and Plan:   Workup does demonstrate fentanyl and opiates in the lab evaluation, at otherwise he has a mild elevation AST and ALT along with mild leukocytosis.  X-ray does demonstrate some right basilar pneumonia that is consistent with likely aspiration pneumonia.  Given this fact, find that he likely does have an aspiration pneumonia will prescribe an outpatient course of Augmentin  to help treat this.  He is otherwise breathing normally maintaining oxygen saturations above 90% without any supplemental oxygen.  He is maintained in the ED under observation, did initially require some soft restraints secondary to being uncooperative and attempting to remove IV lines.  During his course here in the ED he became more sedate, and soft restraints were removed.  Throughout his time he was progressively improved and mental status and feel comfortable at this time discharging to home with outpatient follow-up.  I sent a prescription of Augmentin  for treatment of his aspiration pneumonia, and will further provide a prescription for naloxone  for  future possible overdoses given that this is his second opioid overdose this year.  His sister is here to take him home, careful return precautions have been given of which she verbalizes understanding and agreement.       Final diagnoses:  Opiate overdose, accidental or unintentional, initial encounter (HCC)  Polysubstance abuse (HCC)  Aspiration pneumonia of right lower lobe, unspecified aspiration pneumonia type [facility])    ED Discharge Orders          Ordered    amoxicillin -clavulanate (AUGMENTIN ) 875-125 MG tablet  Every  12 hours,   Status:  Discontinued        04/12/24 1841    amoxicillin -clavulanate (AUGMENTIN ) 875-125 MG tablet  Every 12 hours        04/12/24 1902    naloxone  (NARCAN ) nasal spray 4 mg/0.1 mL        04/12/24 1910               Myriam Dorn BROCKS, [place] 04/12/24 1911    Patt Alm Macho, MD 04/12/24 2219

## 2024-04-12 NOTE — ED Triage Notes (Signed)
 BIBA with c/o drug overdose. Pt states he took a pill, probably Fentanyl, smoked some weed and K2. Pt is restless, confused, lethargic. Given 2 of narcan  PTA

## 2024-04-12 NOTE — ED Notes (Signed)
 Pt continuously trying to get out of bed, pt stumbling upon standing and refusing to sit in the bed. Security at bedside to assist staff with placing pt in non violent restraints

## 2024-05-08 ENCOUNTER — Emergency Department (HOSPITAL_COMMUNITY)
Admission: EM | Admit: 2024-05-08 | Discharge: 2024-05-08 | Disposition: A | Discharge status: Home / Self Care | Attending: Emergency Medicine | Admitting: Emergency Medicine

## 2024-05-08 ENCOUNTER — Emergency Department (HOSPITAL_COMMUNITY)

## 2024-05-08 ENCOUNTER — Encounter (HOSPITAL_COMMUNITY): Payer: Self-pay

## 2024-05-08 DIAGNOSIS — J069 Acute upper respiratory infection, unspecified: Secondary | ICD-10-CM

## 2024-05-08 LAB — CBC
HCT: 51.3 % (ref 39.0–52.0)
Hemoglobin: 17.5 g/dL — ABNORMAL HIGH (ref 13.0–17.0)
MCH: 30.8 pg (ref 26.0–34.0)
MCHC: 34.1 g/dL (ref 30.0–36.0)
MCV: 90.3 fL (ref 80.0–100.0)
Platelets: 349 K/uL (ref 150–400)
RBC: 5.68 MIL/uL (ref 4.22–5.81)
RDW: 12.9 % (ref 11.5–15.5)
WBC: 13.7 K/uL — ABNORMAL HIGH (ref 4.0–10.5)
nRBC: 0 % (ref 0.0–0.2)

## 2024-05-08 LAB — BASIC METABOLIC PANEL WITH GFR
Anion gap: 10 (ref 5–15)
BUN: 9 mg/dL (ref 6–20)
CO2: 28 mmol/L (ref 22–32)
Calcium: 10.1 mg/dL (ref 8.9–10.3)
Chloride: 102 mmol/L (ref 98–111)
Creatinine, Ser: 0.78 mg/dL (ref 0.61–1.24)
GFR, Estimated: >60 mL/min (ref >60)
Glucose, Bld: 108 mg/dL — ABNORMAL HIGH (ref 70–99)
Potassium: 4.4 mmol/L (ref 3.5–5.1)
Sodium: 139 mmol/L (ref 135–145)

## 2024-05-08 LAB — RESP PANEL BY RT-PCR (RSV, FLU A&B, COVID)  RVPGX2
Influenza A by PCR: NEGATIVE
Influenza B by PCR: NEGATIVE
Resp Syncytial Virus by PCR: NEGATIVE
SARS Coronavirus 2 by RT PCR: NEGATIVE

## 2024-05-08 MED ORDER — ONDANSETRON 8 MG PO TBDP: 8.0000 mg | ORAL_TABLET | Freq: Three times a day (TID) | ORAL | 0 refills | Status: AC | PRN

## 2024-05-08 MED ORDER — GUAIFENESIN ER 1200 MG PO TB12: 1.0000 | ORAL_TABLET | Freq: Two times a day (BID) | ORAL | 0 refills | Status: AC

## 2024-05-08 MED ORDER — NAPROXEN 375 MG PO TABS: 375.0000 mg | ORAL_TABLET | Freq: Two times a day (BID) | ORAL | 0 refills | Status: AC

## 2024-05-08 NOTE — ED Triage Notes (Signed)
 Pt came in for diarrhea, vomiting, nasal congestion and coughing since Thursday. Pt stated he has a productive cough as well.

## 2024-05-08 NOTE — Discharge Instructions (Addendum)
 Take the medications to help with your nausea cough and congestion.  Drink plenty of fluids.  Follow-up with your doctor next week to be rechecked if your symptoms have not resolved

## 2024-05-08 NOTE — ED Provider Notes (Signed)
 [place] EMERGENCY DEPARTMENT AT [facility] Provider Note   CSN: [id number] Arrival date & time: 05/08/24  1512     Patient presents with: No chief complaint on file.   Daniel [name] is a 28 y.o. male.   HPI   Patient has a history of ADHD, opiate use disorder.  Patient presented to the ED with complaints of cough congestion.  Patient states has been having symptoms ongoing since Thursday he has also had some intermittent vomiting and diarrhea.  Patient does not have any abdominal pain.  No sore throat.  Patient was feeling weaker and lightheaded so he came to the ED for evaluation  Prior to Admission medications   Medication Sig Start Date End Date Taking? Authorizing Provider  Guaifenesin  1200 MG TB12 Take 1 tablet (1,200 mg total) by mouth 2 (two) times daily at 10 AM and 5 PM. 05/08/24  Yes Randol Simmonds, MD  naproxen  (NAPROSYN ) 375 MG tablet Take 1 tablet (375 mg total) by mouth 2 (two) times daily. 05/08/24  Yes Randol Simmonds, MD  ondansetron  (ZOFRAN -ODT) 8 MG disintegrating tablet Take 1 tablet (8 mg total) by mouth every 8 (eight) hours as needed for nausea or vomiting. 05/08/24  Yes Randol Simmonds, MD  albuterol  (VENTOLIN  HFA) 108 (90 Base) MCG/ACT inhaler Inhale 1-2 puffs into the lungs every 6 (six) hours as needed for wheezing or shortness of breath. 06/05/23   Ruthell Lonni FALCON, PA-C  amoxicillin -clavulanate (AUGMENTIN ) 875-125 MG tablet Take 1 tablet by mouth every 12 (twelve) hours. 04/12/24   Myriam Dorn BROCKS, PA  naloxone  (NARCAN ) nasal spray 4 mg/0.1 mL To be used as needed for opioid overdose.  To be administered by nose. 04/12/24   Myriam Dorn BROCKS, PA    Allergies: Patient has no known allergies.    Review of Systems  Updated Vital Signs BP 113/76 (BP Location: Left Arm)   Pulse 82   Temp 98.1 F (36.7 C) (Oral)   Resp 18   Ht 1.88 m (6' 2)   Wt 99.8 kg   SpO2 100%   BMI 28.25 kg/m   Physical Exam Vitals and nursing note  reviewed.  Constitutional:      General: He is not in acute distress.    Appearance: He is well-developed.  HENT:     Head: Normocephalic and atraumatic.     Right Ear: External ear normal.     Left Ear: External ear normal.     Mouth/Throat:     Pharynx: No oropharyngeal exudate or posterior oropharyngeal erythema.  Eyes:     General: No scleral icterus.       Right eye: No discharge.        Left eye: No discharge.     Conjunctiva/sclera: Conjunctivae normal.  Neck:     Trachea: No tracheal deviation.  Cardiovascular:     Rate and Rhythm: Normal rate and regular rhythm.  Pulmonary:     Effort: Pulmonary effort is normal. No respiratory distress.     Breath sounds: Normal breath sounds. No stridor. No wheezing or rales.  Abdominal:     General: Bowel sounds are normal. There is no distension.     Palpations: Abdomen is soft.     Tenderness: There is no abdominal tenderness. There is no guarding or rebound.  Musculoskeletal:        General: No tenderness or deformity.     Cervical back: Neck supple.  Skin:    General: Skin is warm and dry.  Findings: No rash.  Neurological:     General: No focal deficit present.     Mental Status: He is alert.     Cranial Nerves: No cranial nerve deficit, dysarthria or facial asymmetry.     Sensory: No sensory deficit.     Motor: No abnormal muscle tone or seizure activity.     Coordination: Coordination normal.  Psychiatric:        Mood and Affect: Mood normal.     (all labs ordered are listed, but only abnormal results are displayed) Labs Reviewed  CBC - Abnormal; Notable for the following components:      Result Value   WBC 13.7 (*)    Hemoglobin 17.5 (*)    All other components within normal limits  BASIC METABOLIC PANEL WITH GFR - Abnormal; Notable for the following components:   Glucose, Bld 108 (*)    All other components within normal limits  RESP PANEL BY RT-PCR (RSV, FLU A&B, COVID)  [id number]     EKG: None  Radiology: DG Chest 2 View Result Date: 05/08/2024 EXAM: 2 VIEW(S) XRAY OF THE CHEST 05/08/2024 04:54:00 PM COMPARISON: 04/12/2024 CLINICAL HISTORY: cough, congestion FINDINGS: LUNGS AND PLEURA: No focal pulmonary opacity. No pleural effusion. No pneumothorax. HEART AND MEDIASTINUM: No acute abnormality of the cardiac and mediastinal silhouettes. BONES AND SOFT TISSUES: No acute osseous abnormality. IMPRESSION: 1. No acute cardiopulmonary process. Electronically signed by: Franky Crease MD 05/08/2024 05:05 PM EST RP Workstation: [id number]     Procedures   Medications Ordered in the ED - No data to display  Clinical Course as of 05/08/24 1740  Sun May 08, 2024  1729 Chest x-ray negative for pneumonia.  COVID flu RSV negative.  CBC metabolic panel normal [JK]    Clinical Course User Index [JK] Randol Simmonds, MD                                 Medical Decision Making Problems Addressed: Upper respiratory tract infection, unspecified type: acute illness or injury that poses a threat to life or bodily functions  Amount and/or Complexity of Data Reviewed Labs: ordered. Decision-making details documented in ED Course. Radiology: ordered and independent interpretation performed.  Risk OTC drugs. Prescription drug management.   Patient presented to the ED for evaluation of cough congestion nausea.  Patient's exam is reassuring.  No erythema or tonsillar exudate noted on exam.  Lungs are clear.  No wheezing or crackles.  Patient's laboratory test did not show any signs of severe dehydration.  He has mild leukocytosis but this is similar to previous values.  COVID flu RSV is negative.  Chest x-ray is negative for pneumonia.  Some suggestive of viral illness.  Will discharge home with medications for his congestion as well as nausea.  NSAIDs for endcap pains.  Evaluation and diagnostic testing in the emergency department does not suggest an emergent condition requiring  admission or immediate intervention beyond what has been performed at this time.  The patient is safe for discharge and has been instructed to return immediately for worsening symptoms, change in symptoms or any other concerns.    Final diagnoses:  Upper respiratory tract infection, unspecified type    ED Discharge Orders          Ordered    naproxen  (NAPROSYN ) 375 MG tablet  2 times daily        05/08/24 1739    Guaifenesin  1200 MG  TB12  2 times daily        05/08/24 1739    ondansetron  (ZOFRAN -ODT) 8 MG disintegrating tablet  Every 8 hours PRN        05/08/24 1739               Randol Simmonds, MD 05/08/24 [phone]
